# Patient Record
Sex: Female | Born: 1955 | Race: Black or African American | Hispanic: No | Smoking: Former smoker
Health system: Southern US, Community
[De-identification: ages and names within clinical notes are randomized; demographics above are authoritative.]

## PROBLEM LIST (undated history)

## (undated) DIAGNOSIS — M170 Bilateral primary osteoarthritis of knee: Secondary | ICD-10-CM

## (undated) DIAGNOSIS — I4891 Unspecified atrial fibrillation: Secondary | ICD-10-CM

## (undated) DIAGNOSIS — J189 Pneumonia, unspecified organism: Secondary | ICD-10-CM

## (undated) DIAGNOSIS — Z8679 Personal history of other diseases of the circulatory system: Secondary | ICD-10-CM

## (undated) DIAGNOSIS — E559 Vitamin D deficiency, unspecified: Secondary | ICD-10-CM

## (undated) DIAGNOSIS — E785 Hyperlipidemia, unspecified: Secondary | ICD-10-CM

## (undated) DIAGNOSIS — R011 Cardiac murmur, unspecified: Secondary | ICD-10-CM

## (undated) DIAGNOSIS — I10 Essential (primary) hypertension: Secondary | ICD-10-CM

## (undated) DIAGNOSIS — E119 Type 2 diabetes mellitus without complications: Secondary | ICD-10-CM

## (undated) DIAGNOSIS — G4730 Sleep apnea, unspecified: Secondary | ICD-10-CM

## (undated) DIAGNOSIS — Z87442 Personal history of urinary calculi: Secondary | ICD-10-CM

## (undated) DIAGNOSIS — D860 Sarcoidosis of lung: Secondary | ICD-10-CM

## (undated) HISTORY — PX: ABLATION: SHX5711

## (undated) HISTORY — PX: STRABISMUS SURGERY: SHX218

## (undated) HISTORY — PX: BREAST BIOPSY: SHX20

## (undated) HISTORY — PX: BREAST EXCISIONAL BIOPSY: SUR124

## (undated) HISTORY — PX: TUBAL LIGATION: SHX77

---

## 1978-10-10 HISTORY — PX: APPENDECTOMY: SHX54

## 2003-10-11 HISTORY — PX: CARDIAC ELECTROPHYSIOLOGY MAPPING AND ABLATION: SHX1292

## 2003-10-11 HISTORY — PX: LUNG BIOPSY: SHX232

## 2006-06-21 ENCOUNTER — Ambulatory Visit: Payer: Self-pay | Admitting: Internal Medicine

## 2006-12-18 ENCOUNTER — Ambulatory Visit: Payer: Self-pay | Admitting: Internal Medicine

## 2007-04-04 ENCOUNTER — Ambulatory Visit: Payer: Self-pay | Admitting: Internal Medicine

## 2007-08-09 ENCOUNTER — Ambulatory Visit: Payer: Self-pay | Admitting: Internal Medicine

## 2008-10-04 ENCOUNTER — Ambulatory Visit: Payer: Self-pay | Admitting: Family Medicine

## 2009-03-02 ENCOUNTER — Ambulatory Visit: Payer: Self-pay | Admitting: Internal Medicine

## 2009-07-08 ENCOUNTER — Ambulatory Visit: Payer: Self-pay | Admitting: Gastroenterology

## 2009-09-13 ENCOUNTER — Ambulatory Visit: Payer: Self-pay | Admitting: Internal Medicine

## 2010-03-18 ENCOUNTER — Ambulatory Visit: Payer: Self-pay | Admitting: Internal Medicine

## 2011-02-21 ENCOUNTER — Emergency Department: Payer: Self-pay | Admitting: Emergency Medicine

## 2011-10-11 HISTORY — PX: BREAST EXCISIONAL BIOPSY: SUR124

## 2012-08-04 ENCOUNTER — Emergency Department: Payer: Self-pay | Admitting: Emergency Medicine

## 2012-08-23 ENCOUNTER — Ambulatory Visit: Payer: Self-pay | Admitting: Internal Medicine

## 2012-09-04 ENCOUNTER — Ambulatory Visit: Payer: Self-pay | Admitting: Emergency Medicine

## 2012-09-04 LAB — BASIC METABOLIC PANEL
Anion Gap: 6 — ABNORMAL LOW (ref 7–16)
BUN: 7 mg/dL (ref 7–18)
Chloride: 102 mmol/L (ref 98–107)
Co2: 30 mmol/L (ref 21–32)
Creatinine: 0.88 mg/dL (ref 0.60–1.30)
EGFR (African American): >60
Osmolality: 277 (ref 275–301)

## 2012-09-13 ENCOUNTER — Ambulatory Visit: Payer: Self-pay | Admitting: Emergency Medicine

## 2013-02-05 ENCOUNTER — Ambulatory Visit: Payer: Self-pay | Admitting: Internal Medicine

## 2013-08-07 ENCOUNTER — Emergency Department: Payer: Self-pay | Admitting: Emergency Medicine

## 2013-08-08 LAB — CBC
HGB: 11.6 g/dL — ABNORMAL LOW (ref 12.0–16.0)
MCV: 74 fL — ABNORMAL LOW (ref 80–100)
RDW: 14.2 % (ref 11.5–14.5)
WBC: 7.6 10*3/uL (ref 3.6–11.0)

## 2013-08-08 LAB — BASIC METABOLIC PANEL
Anion Gap: 6 — ABNORMAL LOW (ref 7–16)
Calcium, Total: 9.2 mg/dL (ref 8.5–10.1)
Chloride: 103 mmol/L (ref 98–107)
Co2: 28 mmol/L (ref 21–32)
EGFR (African American): 59 — ABNORMAL LOW
EGFR (Non-African Amer.): 50 — ABNORMAL LOW
Glucose: 209 mg/dL — ABNORMAL HIGH (ref 65–99)
Potassium: 3.6 mmol/L (ref 3.5–5.1)

## 2013-09-13 ENCOUNTER — Ambulatory Visit: Payer: Self-pay | Admitting: Internal Medicine

## 2014-03-14 ENCOUNTER — Ambulatory Visit: Payer: Self-pay | Admitting: Internal Medicine

## 2014-07-04 ENCOUNTER — Ambulatory Visit: Payer: Self-pay | Admitting: Nurse Practitioner

## 2014-09-26 ENCOUNTER — Ambulatory Visit: Payer: Self-pay | Admitting: Internal Medicine

## 2015-01-27 NOTE — Op Note (Signed)
PATIENT NAME:  Judy Barber, Judy Barber MR#:  [id number] DATE OF BIRTH:  [birth year]  DATE OF PROCEDURE:  09/13/2012  PREOPERATIVE DIAGNOSIS:   Not dictated.   POSTOPERATIVE DIAGNOSIS:  Not dictated.   PROCEDURE:  Not dictated.   SURGEON:  Jovita GammaMasud Stanly Si, MD  ANESTHESIA:  General.   INDICATION FOR PROCEDURE:  This patient had microcalcification in the right breast. The patient was then brought in for needle localization biopsy. After that was done, the patient was brought to surgery.   DESCRIPTION OF PROCEDURE:  Under general anesthesia, the right breast was then prepped and draped. The needle was coming laterally from the axilla site going into the middle of the breast. It was quite very deep down. The localized area of the microcalcification also was also a distance from the wire. After infiltrating with 1% Xylocaine, the needle insertion site incision was made and I had to go down way deep in the breast tissue to remove this wire as well as the microcalcification. The patient had a lot of oozing, although there was no major bleeding, but there was a lot of oozing. I tried to stop the bleeding with a Bovie, but finally I thought maybe we should put in a drain so that she does not form hematoma. A small Penrose drain was then put in. Subcuticular closure was performed with 3-0 Vicryl, 4-0 Vicryl and sutures. Skin was closed with nylon stitches and the drain was sutured with nylon sutures. Drain was removed later on. The patient tolerated the procedure well and was sent to the recovery room in satisfactory condition.   ____________________________ Alton RevereMasud S. Cecelia ByarsHashmi, MD msh:ap D: 09/13/2012 15:08:24 ET T: 09/13/2012 15:26:24 ET JOB#: [id number] cc: Nola Botkins S. Cecelia ByarsHashmi, MD, <Dictator> Meryle ReadyMASUD S Durene Dodge MD ELECTRONICALLY SIGNED 09/17/2012 13:55

## 2015-10-11 HISTORY — PX: BREAST BIOPSY: SHX20

## 2016-01-01 DIAGNOSIS — D860 Sarcoidosis of lung: Secondary | ICD-10-CM | POA: Insufficient documentation

## 2016-01-01 DIAGNOSIS — I10 Essential (primary) hypertension: Secondary | ICD-10-CM | POA: Insufficient documentation

## 2016-01-01 DIAGNOSIS — E1165 Type 2 diabetes mellitus with hyperglycemia: Secondary | ICD-10-CM | POA: Insufficient documentation

## 2016-01-01 DIAGNOSIS — E559 Vitamin D deficiency, unspecified: Secondary | ICD-10-CM | POA: Insufficient documentation

## 2016-03-24 ENCOUNTER — Other Ambulatory Visit: Payer: Self-pay | Admitting: Family Medicine

## 2016-04-11 ENCOUNTER — Other Ambulatory Visit: Payer: Self-pay | Admitting: Family Medicine

## 2016-04-11 DIAGNOSIS — Z1231 Encounter for screening mammogram for malignant neoplasm of breast: Secondary | ICD-10-CM

## 2017-03-01 HISTORY — PX: KNEE ARTHROSCOPY W/ MENISCECTOMY: SHX1879

## 2017-10-10 HISTORY — PX: COLONOSCOPY: SHX174

## 2018-05-21 ENCOUNTER — Other Ambulatory Visit: Payer: Self-pay | Admitting: Family Medicine

## 2018-05-21 DIAGNOSIS — M7989 Other specified soft tissue disorders: Secondary | ICD-10-CM

## 2018-05-22 ENCOUNTER — Other Ambulatory Visit: Payer: Self-pay | Admitting: Medical Oncology

## 2018-05-22 DIAGNOSIS — M7989 Other specified soft tissue disorders: Secondary | ICD-10-CM

## 2018-05-24 ENCOUNTER — Ambulatory Visit: Payer: 59

## 2020-01-01 ENCOUNTER — Other Ambulatory Visit: Payer: Self-pay | Admitting: Family Medicine

## 2020-01-01 DIAGNOSIS — Z1231 Encounter for screening mammogram for malignant neoplasm of breast: Secondary | ICD-10-CM

## 2020-01-13 ENCOUNTER — Ambulatory Visit
Admission: RE | Admit: 2020-01-13 | Discharge: 2020-01-13 | Disposition: A | Discharge status: Home / Self Care | Payer: 59 | Source: Ambulatory Visit | Attending: Family Medicine | Admitting: Family Medicine

## 2020-01-13 ENCOUNTER — Other Ambulatory Visit: Payer: Self-pay

## 2020-01-13 ENCOUNTER — Encounter (INDEPENDENT_AMBULATORY_CARE_PROVIDER_SITE_OTHER): Payer: Self-pay

## 2020-01-13 DIAGNOSIS — Z1231 Encounter for screening mammogram for malignant neoplasm of breast: Secondary | ICD-10-CM

## 2020-01-14 ENCOUNTER — Inpatient Hospital Stay
Admission: RE | Admit: 2020-01-14 | Discharge: 2020-01-14 | Disposition: A | Discharge status: Home / Self Care | Payer: Self-pay | Source: Ambulatory Visit | Attending: *Deleted | Admitting: *Deleted

## 2020-01-14 ENCOUNTER — Other Ambulatory Visit: Payer: Self-pay | Admitting: *Deleted

## 2020-01-14 DIAGNOSIS — Z1231 Encounter for screening mammogram for malignant neoplasm of breast: Secondary | ICD-10-CM

## 2020-04-16 ENCOUNTER — Emergency Department
Admission: EM | Admit: 2020-04-16 | Discharge: 2020-04-16 | Disposition: A | Discharge status: Home / Self Care | Payer: 59 | Attending: Emergency Medicine | Admitting: Emergency Medicine

## 2020-04-16 ENCOUNTER — Other Ambulatory Visit: Payer: Self-pay

## 2020-04-16 ENCOUNTER — Encounter: Payer: Self-pay | Admitting: Emergency Medicine

## 2020-04-16 DIAGNOSIS — S0006XA Insect bite (nonvenomous) of scalp, initial encounter: Secondary | ICD-10-CM | POA: Diagnosis not present

## 2020-04-16 DIAGNOSIS — Y929 Unspecified place or not applicable: Secondary | ICD-10-CM | POA: Diagnosis not present

## 2020-04-16 DIAGNOSIS — Y999 Unspecified external cause status: Secondary | ICD-10-CM | POA: Insufficient documentation

## 2020-04-16 DIAGNOSIS — S00262A Insect bite (nonvenomous) of left eyelid and periocular area, initial encounter: Secondary | ICD-10-CM | POA: Diagnosis not present

## 2020-04-16 DIAGNOSIS — Y939 Activity, unspecified: Secondary | ICD-10-CM | POA: Diagnosis not present

## 2020-04-16 DIAGNOSIS — W57XXXA Bitten or stung by nonvenomous insect and other nonvenomous arthropods, initial encounter: Secondary | ICD-10-CM | POA: Insufficient documentation

## 2020-04-16 DIAGNOSIS — S0990XA Unspecified injury of head, initial encounter: Secondary | ICD-10-CM | POA: Diagnosis present

## 2020-04-16 MED ORDER — HYDROXYZINE HCL 25 MG PO TABS: 25.0000 mg | ORAL_TABLET | Freq: Three times a day (TID) | ORAL | 0 refills | Status: DC | PRN

## 2020-04-16 MED ORDER — HYDROMORPHONE HCL 1 MG/ML IJ SOLN
1.0000 mg | Freq: Once | INTRAMUSCULAR | Status: AC
  Administered 2020-04-16: 1 mg via INTRAVENOUS
  Filled 2020-04-16: qty 1

## 2020-04-16 MED ORDER — TRAMADOL HCL 50 MG PO TABS: 50.0000 mg | ORAL_TABLET | Freq: Two times a day (BID) | ORAL | 0 refills | Status: AC | PRN

## 2020-04-16 MED ORDER — METHYLPREDNISOLONE SODIUM SUCC 125 MG IJ SOLR
125.0000 mg | Freq: Once | INTRAMUSCULAR | Status: AC
  Administered 2020-04-16: 125 mg via INTRAVENOUS
  Filled 2020-04-16: qty 2

## 2020-04-16 MED ORDER — METHYLPREDNISOLONE 4 MG PO TBPK: ORAL_TABLET | ORAL | 0 refills | Status: DC

## 2020-04-16 MED ORDER — DIPHENHYDRAMINE HCL 50 MG/ML IJ SOLN
25.0000 mg | Freq: Once | INTRAMUSCULAR | Status: AC
  Administered 2020-04-16: 25 mg via INTRAVENOUS
  Filled 2020-04-16: qty 1

## 2020-04-16 NOTE — Discharge Instructions (Signed)
Follow discharge care instruction take medication as directed. °

## 2020-04-16 NOTE — ED Provider Notes (Addendum)
MSE was initiated and I personally evaluated the patient and placed orders (if any) at  9:04 AM on April 16, 2020.  Insect bite to facial and scalp this morning. States pain and itching. Denies N/V. Denies vision disturbance or vertigo.  The patient appears stable so that the remainder of the MSE may be completed by another provider.   Joni Reining, PA-C 04/16/20 0905    Joni Reining, PA-C 04/16/20 3976    Chesley Noon, MD 04/16/20 [phone]

## 2020-04-16 NOTE — ED Triage Notes (Signed)
Pt reports was stung by something above her left eye and in her head. Pt unsure of what she was stung by.

## 2020-04-16 NOTE — ED Provider Notes (Signed)
[facility] Emergency Department Provider Note   ____________________________________________   First MD Initiated Contact with Patient 04/16/20 1015     (approximate)  I have reviewed the triage vital signs and the nursing notes.   HISTORY  Chief Complaint Insect Bite    HPI Judy Barber is a 64 y.o. female patient complaining of insect bite above her left eye and also the right superior aspect of the scalp.  Patient unsure what insect bit her.  Patient complaining of pain and swelling left supraorbital area and right scalp.  Also complaining of itching.  Patient rates her pain as a 7/10.  Patient described pain is "achy".  No palliative measure for complaint.         History reviewed. No pertinent past medical history.  There are no problems to display for this patient.   Past Surgical History:  Procedure Laterality Date   BREAST BIOPSY Left    benign   BREAST EXCISIONAL BIOPSY Right    benign    Prior to Admission medications   Medication Sig Start Date End Date Taking? Authorizing Provider  hydrOXYzine (ATARAX/VISTARIL) 25 MG tablet Take 1 tablet (25 mg total) by mouth 3 (three) times daily as needed. 04/16/20   Joni Reining, PA-C  methylPREDNISolone (MEDROL DOSEPAK) 4 MG TBPK tablet Take Tapered dose as directed 04/16/20   Joni Reining, PA-C  traMADol (ULTRAM) 50 MG tablet Take 1 tablet (50 mg total) by mouth every 12 (twelve) hours as needed for up to 5 days. 04/16/20 04/21/20  Joni Reining, PA-C    Allergies Patient has no allergy information on record.  Family History  Problem Relation Age of Onset   Breast cancer Cousin     Social History Social History   Tobacco Use   Smoking status: Not on file  Substance Use Topics   Alcohol use: Not on file   Drug use: Not on file    Review of Systems Constitutional: No fever/chills Eyes: No visual changes. ENT: No sore throat. Cardiovascular: Denies chest  pain. Respiratory: Denies shortness of breath. Gastrointestinal: No abdominal pain.  No nausea, no vomiting.  No diarrhea.  No constipation. Genitourinary: Negative for dysuria. Musculoskeletal: Negative for back pain. Skin: Red and swollen left supraorbital area and right scalp.  Neurological: Positive for headaches, but denies focal weakness or numbness.   ____________________________________________   PHYSICAL EXAM:  VITAL SIGNS: ED Triage Vitals [04/16/20 0847]  Enc Vitals Group     BP (!) 187/61     Pulse Rate (!) 110     Resp 18     Temp 97.7 F (36.5 C)     Temp Source Oral     SpO2 100 %     Weight      Height      Head Circumference      Peak Flow      Pain Score      Pain Loc      Pain Edu?      Excl. in GC?    Constitutional: Alert and oriented. Well appearing and in no acute distress. Cardiovascular: Normal rate, regular rhythm. Grossly normal heart sounds.  Good peripheral circulation.  Elevated blood pressure. Respiratory: Normal respiratory effort.  No retractions. Lungs CTAB. Neurologic:  Normal speech and language. No gross focal neurologic deficits are appreciated. No gait instability. Skin: Papular vesicular lesion left supraorbital area and right lateral scalp. Psychiatric: Mood and affect are normal. Speech and behavior are  normal.  ____________________________________________   LABS (all labs ordered are listed, but only abnormal results are displayed)  Labs Reviewed - No data to display ____________________________________________  EKG   ____________________________________________  RADIOLOGY  ED MD interpretation:    Official radiology report(s): No results found.  ____________________________________________   PROCEDURES  Procedure(s) performed (including Critical Care):  Procedures   ____________________________________________   INITIAL IMPRESSION / ASSESSMENT AND PLAN / ED COURSE  As part of my medical decision  making, I reviewed the following data within the electronic MEDICAL RECORD NUMBER     Patient presents with pain to the left supraorbital area and left scalp secondary to suspected insect bites.  Patient given discharge care instructions and a work note.  Patient vies take medication as directed.    Judy Barber was evaluated in Emergency Department on 04/16/2020 for the symptoms described in the history of present illness. She was evaluated in the context of the global COVID-19 pandemic, which necessitated consideration that the patient might be at risk for infection with the SARS-CoV-2 virus that causes COVID-19. Institutional protocols and algorithms that pertain to the evaluation of patients at risk for COVID-19 are in a state of rapid change based on information released by regulatory bodies including the CDC and federal and state organizations. These policies and algorithms were followed during the patient's care in the ED.       ____________________________________________   FINAL CLINICAL IMPRESSION(S) / ED DIAGNOSES  Final diagnoses:  Insect bite of scalp, initial encounter  Insect bite of left periocular area, initial encounter     ED Discharge Orders         Ordered    hydrOXYzine (ATARAX/VISTARIL) 25 MG tablet  3 times daily PRN     Discontinue  Reprint     04/16/20 1045    methylPREDNISolone (MEDROL DOSEPAK) 4 MG TBPK tablet     Discontinue  Reprint     04/16/20 1045    traMADol (ULTRAM) 50 MG tablet  Every 12 hours PRN     Discontinue  Reprint     04/16/20 1045           Note:  This document was prepared using Dragon voice recognition software and may include unintentional dictation errors.    Joni Reining, PA-C 04/16/20 1051    Chesley Noon, MD 04/16/20 1326

## 2020-10-15 ENCOUNTER — Other Ambulatory Visit: Payer: Self-pay

## 2020-10-15 ENCOUNTER — Encounter: Payer: Self-pay | Admitting: Emergency Medicine

## 2020-10-15 ENCOUNTER — Emergency Department
Admission: EM | Admit: 2020-10-15 | Discharge: 2020-10-15 | Disposition: A | Discharge status: Home / Self Care | Payer: 59 | Attending: Emergency Medicine | Admitting: Emergency Medicine

## 2020-10-15 ENCOUNTER — Emergency Department: Payer: 59

## 2020-10-15 DIAGNOSIS — Z87891 Personal history of nicotine dependence: Secondary | ICD-10-CM | POA: Diagnosis not present

## 2020-10-15 DIAGNOSIS — E119 Type 2 diabetes mellitus without complications: Secondary | ICD-10-CM | POA: Insufficient documentation

## 2020-10-15 DIAGNOSIS — I10 Essential (primary) hypertension: Secondary | ICD-10-CM | POA: Diagnosis not present

## 2020-10-15 DIAGNOSIS — Z20822 Contact with and (suspected) exposure to covid-19: Secondary | ICD-10-CM | POA: Insufficient documentation

## 2020-10-15 DIAGNOSIS — R112 Nausea with vomiting, unspecified: Secondary | ICD-10-CM

## 2020-10-15 DIAGNOSIS — M25511 Pain in right shoulder: Secondary | ICD-10-CM | POA: Insufficient documentation

## 2020-10-15 LAB — CBC WITH DIFFERENTIAL/PLATELET
Abs Immature Granulocytes: 0.03 10*3/uL (ref 0.00–0.07)
Basophils Absolute: 0 10*3/uL (ref 0.0–0.1)
Basophils Relative: 0 %
Eosinophils Absolute: 0.1 10*3/uL (ref 0.0–0.5)
Eosinophils Relative: 1 %
HCT: 42.6 % (ref 36.0–46.0)
Hemoglobin: 13.4 g/dL (ref 12.0–15.0)
Immature Granulocytes: 1 %
Lymphocytes Relative: 20 %
Lymphs Abs: 1.3 10*3/uL (ref 0.7–4.0)
MCH: 23.4 pg — ABNORMAL LOW (ref 26.0–34.0)
MCHC: 31.5 g/dL (ref 30.0–36.0)
MCV: 74.3 fL — ABNORMAL LOW (ref 80.0–100.0)
Monocytes Absolute: 0.4 10*3/uL (ref 0.1–1.0)
Monocytes Relative: 5 %
Neutro Abs: 4.7 10*3/uL (ref 1.7–7.7)
Neutrophils Relative %: 73 %
Platelets: 279 10*3/uL (ref 150–400)
RBC: 5.73 MIL/uL — ABNORMAL HIGH (ref 3.87–5.11)
RDW: 14.1 % (ref 11.5–15.5)
WBC: 6.5 10*3/uL (ref 4.0–10.5)
nRBC: 0 % (ref 0.0–0.2)

## 2020-10-15 LAB — LIPASE, BLOOD: Lipase: 27 U/L (ref 11–51)

## 2020-10-15 LAB — COMPREHENSIVE METABOLIC PANEL
ALT: 14 U/L (ref 0–44)
AST: 18 U/L (ref 15–41)
Albumin: 4 g/dL (ref 3.5–5.0)
Alkaline Phosphatase: 98 U/L (ref 38–126)
Anion gap: 10 (ref 5–15)
BUN: 17 mg/dL (ref 8–23)
CO2: 23 mmol/L (ref 22–32)
Calcium: 9.4 mg/dL (ref 8.9–10.3)
Chloride: 106 mmol/L (ref 98–111)
Creatinine, Ser: 0.79 mg/dL (ref 0.44–1.00)
GFR, Estimated: >60 mL/min (ref >60)
Glucose, Bld: 179 mg/dL — ABNORMAL HIGH (ref 70–99)
Potassium: 4 mmol/L (ref 3.5–5.1)
Sodium: 139 mmol/L (ref 135–145)
Total Bilirubin: 0.5 mg/dL (ref 0.3–1.2)
Total Protein: 7.9 g/dL (ref 6.5–8.1)

## 2020-10-15 LAB — TROPONIN I (HIGH SENSITIVITY)
Troponin I (High Sensitivity): 3 ng/L (ref <18)
Troponin I (High Sensitivity): 5 ng/L (ref <18)

## 2020-10-15 LAB — SARS CORONAVIRUS 2 (TAT 6-24 HRS): SARS Coronavirus 2: NEGATIVE

## 2020-10-15 MED ORDER — ONDANSETRON 4 MG PO TBDP
4.0000 mg | ORAL_TABLET | Freq: Once | ORAL | Status: AC
  Administered 2020-10-15: 4 mg via ORAL
  Filled 2020-10-15: qty 1

## 2020-10-15 MED ORDER — ONDANSETRON 4 MG PO TBDP: 4.0000 mg | ORAL_TABLET | Freq: Three times a day (TID) | ORAL | 0 refills | Status: DC | PRN

## 2020-10-15 MED ORDER — KETOROLAC TROMETHAMINE 30 MG/ML IJ SOLN
15.0000 mg | Freq: Once | INTRAMUSCULAR | Status: AC
  Administered 2020-10-15: 15 mg via INTRAVENOUS
  Filled 2020-10-15: qty 1

## 2020-10-15 MED ORDER — ACETAMINOPHEN 500 MG PO TABS
1000.0000 mg | ORAL_TABLET | Freq: Once | ORAL | Status: AC
  Administered 2020-10-15: 1000 mg via ORAL
  Filled 2020-10-15: qty 2

## 2020-10-15 NOTE — ED Triage Notes (Signed)
Pt to triage via w/c with no distress noted; pt reports N/V that began while at work tonight; denies pain

## 2020-10-15 NOTE — ED Provider Notes (Signed)
[facility] Emergency Department Provider Note  ____________________________________________   Event Date/Time   First MD Initiated Contact with Patient 10/15/20 [phone]     (approximate)  I have reviewed the triage vital signs and the nursing notes.   HISTORY  Chief Complaint Emesis    HPI Judy Barber is a 65 y.o. female with hypertension, diabetes who comes in with vomiting.  Patient states that she was at work when she started feeling sweaty all over and had some nausea and vomiting x1.  The vomiting was nonbloody and nonbilious.  She denies any abdominal pain associated with it.  She was told by her manager to come into the emergency room.  Patient's been waiting for 7 hours to be seen.  While she was waiting she developed some pain in her right shoulder.  She is not sure if is just from sitting in the chair for that long.  Denies any heart problems in the past.  Denies any pain up in her chest or in her abdomen.  Denies any shortness of breath, new unilateral leg swelling, long travel, recent surgery.  Denies any fevers.  Is around COVID patients so is concerned that they could be COVID.  Also has a history of pneumonia.       History reviewed. No pertinent past medical history.  There are no problems to display for this patient.   Past Surgical History:  Procedure Laterality Date  . BREAST BIOPSY Left    benign  . BREAST EXCISIONAL BIOPSY Right    benign    Prior to Admission medications   Medication Sig Start Date End Date Taking? Authorizing Provider  hydrOXYzine (ATARAX/VISTARIL) 25 MG tablet Take 1 tablet (25 mg total) by mouth 3 (three) times daily as needed. 04/16/20   Joni Reining, PA-C  methylPREDNISolone (MEDROL DOSEPAK) 4 MG TBPK tablet Take Tapered dose as directed 04/16/20   Joni Reining, PA-C    Allergies Patient has no known allergies.  Family History  Problem Relation Age of Onset  . Breast cancer Cousin     Social  History Social History   Tobacco Use  . Smoking status: Former Games developer  . Smokeless tobacco: Never Used  Vaping Use  . Vaping Use: Never used      Review of Systems Constitutional: No fever/chills Eyes: No visual changes. ENT: No sore throat. Cardiovascular: Denies chest pain.  Positive right back pain Respiratory: Denies shortness of breath. Gastrointestinal: No abdominal pain.  Positive nausea and vomiting no diarrhea.  No constipation. Genitourinary: Negative for dysuria. Musculoskeletal: Positive right back pain Skin: Negative for rash. Neurological: Negative for headaches, focal weakness or numbness. All other ROS negative ____________________________________________   PHYSICAL EXAM:  VITAL SIGNS: ED Triage Vitals  Enc Vitals Group     BP 10/15/20 0224 (!) 161/73     Pulse Rate 10/15/20 0224 82     Resp 10/15/20 0224 18     Temp 10/15/20 0224 (!) 97.5 F (36.4 C)     Temp Source 10/15/20 0224 Oral     SpO2 10/15/20 0224 98 %     Weight 10/15/20 0225 184 lb (83.5 kg)     Height 10/15/20 0225 5\' 3"  (1.6 m)     Head Circumference --      Peak Flow --      Pain Score 10/15/20 0227 0     Pain Loc --      Pain Edu? --  Excl. in GC? --     Constitutional: Alert and oriented. Well appearing and in no acute distress. Eyes: Conjunctivae are normal. EOMI. Head: Atraumatic. Nose: No congestion/rhinnorhea. Mouth/Throat: Mucous membranes are moist.   Neck: No stridor. Trachea Midline. FROM Cardiovascular: Normal rate, regular rhythm. Grossly normal heart sounds.  Good peripheral circulation. Respiratory: Normal respiratory effort.  No retractions. Lungs CTAB. Gastrointestinal: Soft and nontender. No distention. No abdominal bruits.  Musculoskeletal: No lower extremity tenderness nor edema. Tenderness to right upper back. No joint effusions. Neurologic:  Normal speech and language. No gross focal neurologic deficits are appreciated.  Skin:  Skin is warm, dry and  intact. No rash noted over the right upper back Psychiatric: Mood and affect are normal. Speech and behavior are normal. GU: Deferred   ____________________________________________   LABS (all labs ordered are listed, but only abnormal results are displayed)  Labs Reviewed  CBC WITH DIFFERENTIAL/PLATELET - Abnormal; Notable for the following components:      Result Value   RBC 5.73 (*)    MCV 74.3 (*)    MCH 23.4 (*)    All other components within normal limits  COMPREHENSIVE METABOLIC PANEL - Abnormal; Notable for the following components:   Glucose, Bld 179 (*)    All other components within normal limits  LIPASE, BLOOD  URINALYSIS, COMPLETE (UACMP) WITH MICROSCOPIC   ____________________________________________   ED ECG REPORT I, Concha Se, the attending physician, personally viewed and interpreted this ECG.  Normal sinus no st elevation no twi normal intervals ____________________________________________  RADIOLOGY   Official radiology report(s): DG Chest 2 View  Result Date: 10/15/2020 CLINICAL DATA:  RIGHT-sided back pain since early this morning EXAM: CHEST - 2 VIEW COMPARISON:  Rib radiographs of 08/04/2012 FINDINGS: Normal heart size, mediastinal contours, and pulmonary vascularity. Scattered chronic interstitial changes throughout both lungs, similar to prior exam. This likely represents underlying fibrosis/chronic interstitial disease. No definite acute infiltrate, pleural effusion or pneumothorax. Scattered endplate spur formation thoracic spine. IMPRESSION: Chronic accentuation of interstitial markings likely representing fibrosis or chronic interstitial lung disease. No acute abnormalities. Electronically Signed   By: Ulyses Southward M.D.   On: 10/15/2020 10:02    ____________________________________________   PROCEDURES  Procedure(s) performed (including Critical Care):  Procedures   ____________________________________________   INITIAL IMPRESSION /  ASSESSMENT AND PLAN / ED COURSE  Judy Barber was evaluated in Emergency Department on 10/15/2020 for the symptoms described in the history of present illness. She was evaluated in the context of the global COVID-19 pandemic, which necessitated consideration that the patient might be at risk for infection with the SARS-CoV-2 virus that causes COVID-19. Institutional protocols and algorithms that pertain to the evaluation of patients at risk for COVID-19 are in a state of rapid change based on information released by regulatory bodies including the CDC and federal and state organizations. These policies and algorithms were followed during the patient's care in the ED.    Patient is a 65 year old who comes in with initially nausea vomiting x1 and sweating.  Labs were ordered to evaluate for eletrolyte abnormalities, AKI, DKA.  Labs are reassuring.  Patient is now reporting some pain in her right shoulder possibly just from sitting for so long but consider atypical presentation of ACS and will order cardiac markers.  Will also order chest x-ray to evaluate for pneumonia, pneumothorax.  Considered PE but thought to be less likely given no other risk factors and she is not short of breath.. not pleuritic pain.  White count is normal no evidence of infection.  Hemoglobin is at baseline. Glucose slightly elevated 179.  No evidence of AKI Lipase was normal.  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   Trop negative x2   Re-evaluated pt and chest pain free now. Pt feels comfortable with dc home. COVID swab pending. Wrote note for work.      ____________________________________________   FINAL CLINICAL IMPRESSION(S) / ED DIAGNOSES   Final diagnoses:  Nausea and vomiting, intractability of vomiting not specified, unspecified vomiting type      MEDICATIONS GIVEN DURING THIS VISIT:  Medications  ondansetron (ZOFRAN-ODT) disintegrating tablet 4 mg (4 mg Oral Given 10/15/20 0233)   ketorolac (TORADOL) 30 MG/ML injection 15 mg (15 mg Intravenous Given 10/15/20 1018)  acetaminophen (TYLENOL) tablet 1,000 mg (1,000 mg Oral Given 10/15/20 1019)     ED Discharge Orders         Ordered    ondansetron (ZOFRAN ODT) 4 MG disintegrating tablet  Every 8 hours PRN        10/15/20 1121           Note:  This document was prepared using Dragon voice recognition software and may include unintentional dictation errors.   Vanessa Viola, MD 10/15/20 [phone]

## 2020-10-15 NOTE — Discharge Instructions (Signed)
Take the Zofran to help with any nausea.  Take Tylenol and ibuprofen to help with any pain.  No evidence of heart attack or pneumonia but return to the ER if you develop worsening symptoms

## 2021-01-13 ENCOUNTER — Other Ambulatory Visit: Payer: Self-pay | Admitting: Family Medicine

## 2021-01-13 DIAGNOSIS — Z1231 Encounter for screening mammogram for malignant neoplasm of breast: Secondary | ICD-10-CM

## 2021-01-26 ENCOUNTER — Other Ambulatory Visit: Payer: Self-pay

## 2021-01-26 ENCOUNTER — Ambulatory Visit
Admission: RE | Admit: 2021-01-26 | Discharge: 2021-01-26 | Disposition: A | Discharge status: Home / Self Care | Payer: 59 | Source: Ambulatory Visit | Attending: Family Medicine | Admitting: Family Medicine

## 2021-01-26 DIAGNOSIS — Z1231 Encounter for screening mammogram for malignant neoplasm of breast: Secondary | ICD-10-CM | POA: Diagnosis present

## 2021-09-13 ENCOUNTER — Ambulatory Visit (INDEPENDENT_AMBULATORY_CARE_PROVIDER_SITE_OTHER): Payer: 59 | Admitting: Obstetrics and Gynecology

## 2021-09-13 ENCOUNTER — Encounter: Payer: Self-pay | Admitting: Obstetrics and Gynecology

## 2021-09-13 ENCOUNTER — Other Ambulatory Visit: Payer: Self-pay

## 2021-09-13 VITALS — BP 122/70 | Ht 63.0 in | Wt 204.0 lb

## 2021-09-13 DIAGNOSIS — R87610 Atypical squamous cells of undetermined significance on cytologic smear of cervix (ASC-US): Secondary | ICD-10-CM

## 2021-09-13 NOTE — Progress Notes (Signed)
Patient ID: Judy Barber, female   DOB: 10-08-1956, 65 y.o.   MRN: [id number]  Reason for Consult: Procedure   Referred by Perrin Maltese, MD  Subjective:     HPI:  Judy Barber is a 65 y.o. female. She is being referred today for a ASCUS HPV negative pap on 08/10/2021. She denies a history of prior abnormal pap smears. She denies a history of cervical procedures.   Gynecological History  No LMP recorded. Patient is postmenopausal.  History reviewed. No pertinent past medical history. Family History  Problem Relation Age of Onset   Breast cancer Cousin    Past Surgical History:  Procedure Laterality Date   BREAST BIOPSY Left    benign   BREAST EXCISIONAL BIOPSY Right    benign    Short Social History:  Social History   Tobacco Use   Smoking status: Former   Smokeless tobacco: Never  Substance Use Topics   Alcohol use: Not on file    No Known Allergies  Current Outpatient Medications  Medication Sig Dispense Refill   aspirin EC 81 MG tablet Take 81 mg by mouth daily. Swallow whole.     cyclobenzaprine (FLEXERIL) 5 MG tablet Take 5 mg by mouth 3 (three) times daily as needed for muscle spasms.     docusate sodium (COLACE) 100 MG capsule Take 100 mg by mouth 2 (two) times daily.     glipiZIDE (GLUCOTROL) 5 MG tablet Take 4 mg by mouth daily before breakfast.     hydrOXYzine (ATARAX/VISTARIL) 25 MG tablet Take 1 tablet (25 mg total) by mouth 3 (three) times daily as needed. 15 tablet 0   losartan (COZAAR) 25 MG tablet Take 25 mg by mouth daily.     meloxicam (MOBIC) 7.5 MG tablet Take 7.5 mg by mouth daily.     metFORMIN (GLUCOPHAGE) 500 MG tablet metformin 500 mg tablet     simvastatin (ZOCOR) 20 MG tablet Take 20 mg by mouth daily.     Vitamin D, Ergocalciferol, (DRISDOL) 1.25 MG (50000 UNIT) CAPS capsule Take 50,000 Units by mouth every 7 (seven) days.     methylPREDNISolone (MEDROL DOSEPAK) 4 MG TBPK tablet Take Tapered dose as directed (Patient not taking:  Reported on 09/13/2021) 21 tablet 0   ondansetron (ZOFRAN ODT) 4 MG disintegrating tablet Take 1 tablet (4 mg total) by mouth every 8 (eight) hours as needed for nausea or vomiting. (Patient not taking: Reported on 09/13/2021) 20 tablet 0   No current facility-administered medications for this visit.    Review of Systems  Constitutional: Negative for chills, fatigue, fever and unexpected weight change.  HENT: Negative for trouble swallowing.  Eyes: Negative for loss of vision.  Respiratory: Negative for cough, shortness of breath and wheezing.  Cardiovascular: Negative for chest pain, leg swelling, palpitations and syncope.  GI: Negative for abdominal pain, blood in stool, diarrhea, nausea and vomiting.  GU: Negative for difficulty urinating, dysuria, frequency and hematuria.  Musculoskeletal: Negative for back pain, leg pain and joint pain.  Skin: Negative for rash.  Neurological: Negative for dizziness, headaches, light-headedness, numbness and seizures.  Psychiatric: Negative for behavioral problem, confusion, depressed mood and sleep disturbance.       Objective:  Objective   Vitals:   09/13/21 0813  BP: 122/70  Weight: 204 lb (92.5 kg)  Height: 5\' 3"  (1.6 m)   Body mass index is 36.14 kg/m.  Physical Exam Vitals and nursing note reviewed. Exam conducted with a chaperone present.  Constitutional:      Appearance: Normal appearance. She is well-developed.  HENT:     Head: Normocephalic and atraumatic.  Eyes:     Extraocular Movements: Extraocular movements intact.     Pupils: Pupils are equal, round, and reactive to light.  Cardiovascular:     Rate and Rhythm: Normal rate and regular rhythm.  Pulmonary:     Effort: Pulmonary effort is normal. No respiratory distress.     Breath sounds: Normal breath sounds.  Abdominal:     General: Abdomen is flat.     Palpations: Abdomen is soft.  Genitourinary:    Comments: External: Normal appearing vulva. No lesions noted.   Speculum examination: Normal appearing cervix. No blood in the vaginal vault. No discharge.   Bimanual examination: Uterus midline, non-tender, normal in size, shape and contour.  No CMT. No adnexal masses. No adnexal tenderness. Pelvis not fixed. Musculoskeletal:        General: No signs of injury.  Skin:    General: Skin is warm and dry.  Neurological:     Mental Status: She is alert and oriented to person, place, and time.  Psychiatric:        Behavior: Behavior normal.        Thought Content: Thought content normal.        Judgment: Judgment normal.    Assessment/Plan:    65 yo with ASCUS HPV NEGATIVE PAP smear  Patients does not meet criteria for colposcopy. ASCCP recommends 3 years follow up for ASCUS pap smear with negative HPV based on 2019 updated guidelines. Advised patient of this practice changes. She elected to proceed with pelvic examination, but not colposcopy. Normal appearance of cervix today on examination.   More than 10 minutes were spent face to face with the patient in the room, reviewing the medical record, labs and images, and coordinating care for the patient. The plan of management was discussed in detail and counseling was provided.    Adelene Idler MD, [facility], [facility] 09/13/2021 8:23 AM

## 2021-10-19 DIAGNOSIS — M1712 Unilateral primary osteoarthritis, left knee: Secondary | ICD-10-CM | POA: Insufficient documentation

## 2021-10-20 ENCOUNTER — Other Ambulatory Visit: Payer: Self-pay | Admitting: Orthopedic Surgery

## 2021-10-20 DIAGNOSIS — M2352 Chronic instability of knee, left knee: Secondary | ICD-10-CM

## 2021-10-20 DIAGNOSIS — M2392 Unspecified internal derangement of left knee: Secondary | ICD-10-CM

## 2021-10-20 DIAGNOSIS — M1712 Unilateral primary osteoarthritis, left knee: Secondary | ICD-10-CM

## 2021-10-30 ENCOUNTER — Other Ambulatory Visit: Payer: Self-pay

## 2021-10-30 ENCOUNTER — Ambulatory Visit
Admission: RE | Admit: 2021-10-30 | Discharge: 2021-10-30 | Disposition: A | Discharge status: Home / Self Care | Payer: 59 | Source: Ambulatory Visit | Attending: Orthopedic Surgery | Admitting: Orthopedic Surgery

## 2021-10-30 DIAGNOSIS — M2352 Chronic instability of knee, left knee: Secondary | ICD-10-CM | POA: Diagnosis present

## 2021-10-30 DIAGNOSIS — M2392 Unspecified internal derangement of left knee: Secondary | ICD-10-CM | POA: Diagnosis present

## 2021-10-30 DIAGNOSIS — M1712 Unilateral primary osteoarthritis, left knee: Secondary | ICD-10-CM | POA: Insufficient documentation

## 2021-11-08 ENCOUNTER — Other Ambulatory Visit: Payer: Self-pay | Admitting: Internal Medicine

## 2021-11-08 DIAGNOSIS — Z1231 Encounter for screening mammogram for malignant neoplasm of breast: Secondary | ICD-10-CM

## 2021-12-08 ENCOUNTER — Encounter: Payer: Self-pay | Admitting: Internal Medicine

## 2022-02-02 ENCOUNTER — Other Ambulatory Visit: Payer: Self-pay | Admitting: Surgery

## 2022-02-04 ENCOUNTER — Inpatient Hospital Stay: Admission: RE | Admit: 2022-02-04 | Payer: 59 | Source: Ambulatory Visit

## 2022-02-04 ENCOUNTER — Encounter
Admission: RE | Admit: 2022-02-04 | Discharge: 2022-02-04 | Disposition: A | Discharge status: Home / Self Care | Payer: 59 | Source: Ambulatory Visit | Attending: Surgery | Admitting: Surgery

## 2022-02-04 VITALS — BP 155/62 | HR 89 | Temp 98.0°F | Resp 18 | Ht 63.0 in | Wt 200.0 lb

## 2022-02-04 DIAGNOSIS — Z01818 Encounter for other preprocedural examination: Secondary | ICD-10-CM | POA: Diagnosis present

## 2022-02-04 DIAGNOSIS — Z01812 Encounter for preprocedural laboratory examination: Secondary | ICD-10-CM

## 2022-02-04 HISTORY — DX: Bilateral primary osteoarthritis of knee: M17.0

## 2022-02-04 HISTORY — DX: Sleep apnea, unspecified: G47.30

## 2022-02-04 HISTORY — DX: Essential (primary) hypertension: I10

## 2022-02-04 HISTORY — DX: Sarcoidosis of lung: D86.0

## 2022-02-04 HISTORY — DX: Personal history of urinary calculi: Z87.442

## 2022-02-04 HISTORY — DX: Type 2 diabetes mellitus without complications: E11.9

## 2022-02-04 HISTORY — DX: Vitamin D deficiency, unspecified: E55.9

## 2022-02-04 HISTORY — DX: Hyperlipidemia, unspecified: E78.5

## 2022-02-04 HISTORY — DX: Pneumonia, unspecified organism: J18.9

## 2022-02-04 LAB — COMPREHENSIVE METABOLIC PANEL
ALT: 15 U/L (ref 0–44)
AST: 15 U/L (ref 15–41)
Albumin: 3.6 g/dL (ref 3.5–5.0)
Alkaline Phosphatase: 116 U/L (ref 38–126)
Anion gap: 8 (ref 5–15)
BUN: 14 mg/dL (ref 8–23)
CO2: 27 mmol/L (ref 22–32)
Calcium: 9.4 mg/dL (ref 8.9–10.3)
Chloride: 104 mmol/L (ref 98–111)
Creatinine, Ser: 0.78 mg/dL (ref 0.44–1.00)
GFR, Estimated: >60 mL/min (ref >60)
Glucose, Bld: 150 mg/dL — ABNORMAL HIGH (ref 70–99)
Potassium: 4.1 mmol/L (ref 3.5–5.1)
Sodium: 139 mmol/L (ref 135–145)
Total Bilirubin: 0.6 mg/dL (ref 0.3–1.2)
Total Protein: 7.3 g/dL (ref 6.5–8.1)

## 2022-02-04 LAB — TYPE AND SCREEN
ABO/RH(D): B POS
Antibody Screen: NEGATIVE

## 2022-02-04 LAB — CBC WITH DIFFERENTIAL/PLATELET
Abs Immature Granulocytes: 0.02 10*3/uL (ref 0.00–0.07)
Basophils Absolute: 0 10*3/uL (ref 0.0–0.1)
Basophils Relative: 0 %
Eosinophils Absolute: 0.1 10*3/uL (ref 0.0–0.5)
Eosinophils Relative: 2 %
HCT: 42 % (ref 36.0–46.0)
Hemoglobin: 12.7 g/dL (ref 12.0–15.0)
Immature Granulocytes: 0 %
Lymphocytes Relative: 25 %
Lymphs Abs: 1.4 10*3/uL (ref 0.7–4.0)
MCH: 21.9 pg — ABNORMAL LOW (ref 26.0–34.0)
MCHC: 30.2 g/dL (ref 30.0–36.0)
MCV: 72.3 fL — ABNORMAL LOW (ref 80.0–100.0)
Monocytes Absolute: 0.6 10*3/uL (ref 0.1–1.0)
Monocytes Relative: 10 %
Neutro Abs: 3.4 10*3/uL (ref 1.7–7.7)
Neutrophils Relative %: 63 %
Platelets: 358 10*3/uL (ref 150–400)
RBC: 5.81 MIL/uL — ABNORMAL HIGH (ref 3.87–5.11)
RDW: 14.3 % (ref 11.5–15.5)
WBC: 5.4 10*3/uL (ref 4.0–10.5)
nRBC: 0 % (ref 0.0–0.2)

## 2022-02-04 LAB — URINALYSIS, ROUTINE W REFLEX MICROSCOPIC
Bilirubin Urine: NEGATIVE
Glucose, UA: NEGATIVE mg/dL
Hgb urine dipstick: NEGATIVE
Ketones, ur: NEGATIVE mg/dL
Leukocytes,Ua: NEGATIVE
Nitrite: NEGATIVE
Protein, ur: NEGATIVE mg/dL
Specific Gravity, Urine: 1.02 (ref 1.005–1.030)
pH: 6 (ref 5.0–8.0)

## 2022-02-04 LAB — SURGICAL PCR SCREEN
MRSA, PCR: NEGATIVE
Staphylococcus aureus: NEGATIVE

## 2022-02-04 NOTE — Patient Instructions (Addendum)
Your procedure is scheduled on: Thursday, May 11 ?Report to the Registration Desk on the 1st floor of the [facility]. ?To find out your arrival time, please call [phone] between 1PM - 3PM on: Wednesday, May 10 ?If your arrival time is 6:00 am, do not arrive prior to that time as the [facility] entrance doors do not open until 6:00 am. ? ?REMEMBER: ?Instructions that are not followed completely may result in serious medical risk, up to and including death; or upon the discretion of your surgeon and anesthesiologist your surgery may need to be rescheduled. ? ?Do not eat food after midnight the night before surgery.  ?No gum chewing, lozengers or hard candies. ? ?You may however, drink water up to 2 hours before you are scheduled to arrive for your surgery. Do not drink anything within 2 hours of your scheduled arrival time. ? ?In addition, your doctor has ordered for you to drink the provided  ?Gatorade G2 ?Drinking this carbohydrate drink up to two hours before surgery helps to reduce insulin resistance and improve patient outcomes. Please complete drinking 2 hours prior to scheduled arrival time. ? ?TAKE THESE MEDICATIONS THE MORNING OF SURGERY WITH A SIP OF WATER: ? ?Azathioprine ?Simvastatin ? ?Stop metformin 2 days prior to surgery. Last day to take metformin is Monday, May 8. Resume AFTER surgery. ? ?One week prior to surgery: May 4 ?Stop aspirin, Meloxicam and Anti-inflammatories (NSAIDS) such as Advil, Aleve, Ibuprofen, Motrin, Naproxen, Naprosyn and Aspirin based products such as Excedrin, Goodys Powder, BC Powder. ?Stop ANY OVER THE COUNTER supplements until after surgery. ?You may however, continue to take Tylenol if needed for pain up until the day of surgery. ? ?No Alcohol for 24 hours before or after surgery. ? ?No Smoking including e-cigarettes for 24 hours prior to surgery.  ?No chewable tobacco products for at least 6 hours prior to surgery.  ?No nicotine patches on the day of  surgery. ? ?Do not use any "recreational" drugs for at least a week prior to your surgery.  ?Please be advised that the combination of cocaine and anesthesia may have negative outcomes, up to and including death. ?If you test positive for cocaine, your surgery will be cancelled. ? ?On the morning of surgery brush your teeth with toothpaste and water, you may rinse your mouth with mouthwash if you wish. ?Do not swallow any toothpaste or mouthwash. ? ?Use CHG Soap as directed on instruction sheet. ? ?Do not wear jewelry, make-up, hairpins, clips or nail polish. ? ?Do not wear lotions, powders, or perfumes.  ? ?Do not shave body from the neck down 48 hours prior to surgery just in case you cut yourself which could leave a site for infection.  ?Also, freshly shaved skin may become irritated if using the CHG soap. ? ?Contact lenses, hearing aids and dentures may not be worn into surgery. ? ?Do not bring valuables to the hospital. [facility] is not responsible for any missing/lost belongings or valuables.  ? ?Notify your doctor if there is any change in your medical condition (cold, fever, infection). ? ?Wear comfortable clothing (specific to your surgery type) to the hospital. ? ?After surgery, you can help prevent lung complications by doing breathing exercises.  ?Take deep breaths and cough every 1-2 hours. Your doctor may order a device called an Incentive Spirometer to help you take deep breaths. ? ?If you are being admitted to the hospital overnight, leave your suitcase in the car. ?After surgery it may be  brought to your room. ? ?If you are being discharged the day of surgery, you will not be allowed to drive home. ?You will need a responsible adult (18 years or older) to drive you home and stay with you that night.  ? ?If you are taking public transportation, you will need to have a responsible adult (18 years or older) with you. ?Please confirm with your physician that it is acceptable to use public  transportation.  ? ?Please call the [facility] Dept. at [phone] if you have any questions about these instructions. ? ?Surgery Visitation Policy: ? ?Patients undergoing a surgery or procedure may have two family members or support persons with them as long as the person is not COVID-19 positive or experiencing its symptoms.  ? ?Inpatient Visitation:   ? ?Visiting hours are 7 a.m. to 8 p.m. ?Up to four visitors are allowed at one time in a patient room, including children. The visitors may rotate out with other people during the day. One designated support person (adult) may remain overnight.  ?

## 2022-02-16 MED ORDER — CHLORHEXIDINE GLUCONATE 0.12 % MT SOLN: 15.0000 mL | Freq: Once | OROMUCOSAL | Status: AC

## 2022-02-16 MED ORDER — ORAL CARE MOUTH RINSE: 15.0000 mL | Freq: Once | OROMUCOSAL | Status: AC

## 2022-02-16 MED ORDER — FAMOTIDINE 20 MG PO TABS: 20.0000 mg | ORAL_TABLET | Freq: Once | ORAL | Status: AC

## 2022-02-16 MED ORDER — CEFAZOLIN SODIUM-DEXTROSE 2-4 GM/100ML-% IV SOLN
2.0000 g | INTRAVENOUS | Status: AC
  Administered 2022-02-17: 2 g via INTRAVENOUS

## 2022-02-16 MED ORDER — SODIUM CHLORIDE 0.9 % IV SOLN
INTRAVENOUS | Status: DC
Start: 2022-02-16 — End: 2022-02-17

## 2022-02-17 ENCOUNTER — Ambulatory Visit
Admission: RE | Admit: 2022-02-17 | Discharge: 2022-02-18 | Disposition: A | Discharge status: Home / Self Care | Payer: 59 | Attending: Surgery | Admitting: Surgery

## 2022-02-17 ENCOUNTER — Other Ambulatory Visit: Payer: Self-pay

## 2022-02-17 ENCOUNTER — Ambulatory Visit: Payer: 59

## 2022-02-17 ENCOUNTER — Encounter
Admission: RE | Disposition: A | Discharge status: Home / Self Care | Payer: Self-pay | Source: Home / Self Care | Attending: Surgery

## 2022-02-17 ENCOUNTER — Ambulatory Visit: Payer: 59 | Admitting: Anesthesiology

## 2022-02-17 DIAGNOSIS — M1712 Unilateral primary osteoarthritis, left knee: Principal | ICD-10-CM | POA: Insufficient documentation

## 2022-02-17 DIAGNOSIS — Z96652 Presence of left artificial knee joint: Secondary | ICD-10-CM | POA: Diagnosis present

## 2022-02-17 HISTORY — PX: PARTIAL KNEE ARTHROPLASTY: SHX2174

## 2022-02-17 LAB — GLUCOSE, CAPILLARY
Glucose-Capillary: 168 mg/dL — ABNORMAL HIGH (ref 70–99)
Glucose-Capillary: 250 mg/dL — ABNORMAL HIGH (ref 70–99)

## 2022-02-17 LAB — ABO/RH: ABO/RH(D): B POS

## 2022-02-17 SURGERY — ARTHROPLASTY, KNEE, UNICOMPARTMENTAL
Anesthesia: Spinal | Site: Knee | Laterality: Left

## 2022-02-17 MED ORDER — ONDANSETRON HCL 4 MG/2ML IJ SOLN
INTRAMUSCULAR | Status: DC | PRN
  Administered 2022-02-17: 4 mg via INTRAVENOUS

## 2022-02-17 MED ORDER — POTASSIUM CHLORIDE IN NACL 20-0.9 MEQ/L-% IV SOLN
INTRAVENOUS | Status: DC
  Filled 2022-02-17 (×3): qty 1000

## 2022-02-17 MED ORDER — SODIUM CHLORIDE 0.9 % BOLUS PEDS
250.0000 mL | Freq: Once | INTRAVENOUS | Status: AC
  Administered 2022-02-17: 250 mL via INTRAVENOUS

## 2022-02-17 MED ORDER — ACETAMINOPHEN 500 MG PO TABS
1000.0000 mg | ORAL_TABLET | Freq: Four times a day (QID) | ORAL | Status: AC
  Administered 2022-02-18: 1000 mg via ORAL
  Filled 2022-02-17 (×2): qty 2

## 2022-02-17 MED ORDER — 0.9 % SODIUM CHLORIDE (POUR BTL) OPTIME
TOPICAL | Status: DC | PRN
Start: 2022-02-17 — End: 2022-02-17
  Administered 2022-02-17: 250 mL

## 2022-02-17 MED ORDER — SODIUM CHLORIDE 0.9 % IR SOLN
Status: DC | PRN
Start: 2022-02-17 — End: 2022-02-17
  Administered 2022-02-17: 3000 mL

## 2022-02-17 MED ORDER — SODIUM CHLORIDE FLUSH 0.9 % IV SOLN
INTRAVENOUS | Status: AC
  Filled 2022-02-17: qty 10

## 2022-02-17 MED ORDER — FENTANYL CITRATE (PF) 100 MCG/2ML IJ SOLN
25.0000 ug | INTRAMUSCULAR | Status: DC | PRN
  Administered 2022-02-17: 25 ug via INTRAVENOUS

## 2022-02-17 MED ORDER — ONDANSETRON HCL 4 MG/2ML IJ SOLN: 4.0000 mg | Freq: Once | INTRAMUSCULAR | Status: DC | PRN

## 2022-02-17 MED ORDER — MIDAZOLAM HCL 5 MG/5ML IJ SOLN
INTRAMUSCULAR | Status: DC | PRN
Start: 2022-02-17 — End: 2022-02-17
  Administered 2022-02-17 (×2): .5 mg via INTRAVENOUS

## 2022-02-17 MED ORDER — FENTANYL CITRATE (PF) 100 MCG/2ML IJ SOLN
INTRAMUSCULAR | Status: AC
Start: 2022-02-17 — End: ?
  Filled 2022-02-17: qty 2

## 2022-02-17 MED ORDER — FENTANYL CITRATE (PF) 100 MCG/2ML IJ SOLN
INTRAMUSCULAR | Status: DC | PRN
  Administered 2022-02-17 (×2): 25 ug via INTRAVENOUS

## 2022-02-17 MED ORDER — BUPIVACAINE-EPINEPHRINE (PF) 0.5% -1:200000 IJ SOLN
INTRAMUSCULAR | Status: AC
  Filled 2022-02-17: qty 30

## 2022-02-17 MED ORDER — OXYCODONE HCL 5 MG PO TABS
ORAL_TABLET | ORAL | Status: AC
  Filled 2022-02-17: qty 2

## 2022-02-17 MED ORDER — ESMOLOL HCL 100 MG/10ML IV SOLN
INTRAVENOUS | Status: DC | PRN
  Administered 2022-02-17: 20 mg via INTRAVENOUS
  Administered 2022-02-17: 10 mg via INTRAVENOUS

## 2022-02-17 MED ORDER — BUPIVACAINE LIPOSOME 1.3 % IJ SUSP
INTRAMUSCULAR | Status: AC
  Filled 2022-02-17: qty 20

## 2022-02-17 MED ORDER — OXYCODONE HCL 5 MG/5ML PO SOLN: 5.0000 mg | Freq: Once | ORAL | Status: DC | PRN

## 2022-02-17 MED ORDER — SODIUM CHLORIDE FLUSH 0.9 % IV SOLN
INTRAVENOUS | Status: AC
  Filled 2022-02-17: qty 40

## 2022-02-17 MED ORDER — PROPOFOL 1000 MG/100ML IV EMUL
INTRAVENOUS | Status: AC
  Filled 2022-02-17: qty 100

## 2022-02-17 MED ORDER — KETOROLAC TROMETHAMINE 15 MG/ML IJ SOLN
15.0000 mg | Freq: Once | INTRAMUSCULAR | Status: AC
  Administered 2022-02-17: 15 mg via INTRAVENOUS

## 2022-02-17 MED ORDER — CEFAZOLIN SODIUM-DEXTROSE 2-4 GM/100ML-% IV SOLN
INTRAVENOUS | Status: AC
  Administered 2022-02-17: 2 g via INTRAVENOUS
  Filled 2022-02-17: qty 100

## 2022-02-17 MED ORDER — OXYCODONE HCL 5 MG PO TABS
5.0000 mg | ORAL_TABLET | ORAL | Status: DC | PRN
  Administered 2022-02-17: 10 mg via ORAL
  Administered 2022-02-17 – 2022-02-18 (×4): 5 mg via ORAL
  Filled 2022-02-17 (×4): qty 1

## 2022-02-17 MED ORDER — ONDANSETRON HCL 4 MG PO TABS
4.0000 mg | ORAL_TABLET | Freq: Four times a day (QID) | ORAL | Status: DC | PRN
Start: 2022-02-17 — End: 2022-02-18

## 2022-02-17 MED ORDER — BUPIVACAINE-EPINEPHRINE (PF) 0.5% -1:200000 IJ SOLN
INTRAMUSCULAR | Status: DC | PRN
  Administered 2022-02-17: 30 mL via PERINEURAL

## 2022-02-17 MED ORDER — TRANEXAMIC ACID 1000 MG/10ML IV SOLN
INTRAVENOUS | Status: AC
  Filled 2022-02-17: qty 10

## 2022-02-17 MED ORDER — PHENYLEPHRINE HCL-NACL 20-0.9 MG/250ML-% IV SOLN
INTRAVENOUS | Status: AC
  Filled 2022-02-17: qty 250

## 2022-02-17 MED ORDER — TRANEXAMIC ACID 1000 MG/10ML IV SOLN
INTRAVENOUS | Status: DC | PRN
  Administered 2022-02-17: 2000 mg via TOPICAL

## 2022-02-17 MED ORDER — ONDANSETRON HCL 4 MG/2ML IJ SOLN: 4.0000 mg | Freq: Four times a day (QID) | INTRAMUSCULAR | Status: DC | PRN

## 2022-02-17 MED ORDER — APIXABAN 2.5 MG PO TABS: 2.5000 mg | ORAL_TABLET | Freq: Two times a day (BID) | ORAL | 0 refills | Status: DC

## 2022-02-17 MED ORDER — OXYCODONE HCL 5 MG PO TABS
ORAL_TABLET | ORAL | Status: AC
  Administered 2022-02-17: 10 mg via ORAL
  Filled 2022-02-17: qty 2

## 2022-02-17 MED ORDER — PROPOFOL 500 MG/50ML IV EMUL
INTRAVENOUS | Status: DC | PRN
  Administered 2022-02-17: 60 ug/kg/min via INTRAVENOUS

## 2022-02-17 MED ORDER — MIDAZOLAM HCL 2 MG/2ML IJ SOLN
INTRAMUSCULAR | Status: AC
Start: 2022-02-17 — End: ?
  Filled 2022-02-17: qty 2

## 2022-02-17 MED ORDER — BUPIVACAINE LIPOSOME 1.3 % IJ SUSP
INTRAMUSCULAR | Status: DC | PRN
Start: 2022-02-17 — End: 2022-02-17
  Administered 2022-02-17: 20 mL

## 2022-02-17 MED ORDER — METOCLOPRAMIDE HCL 10 MG PO TABS
5.0000 mg | ORAL_TABLET | Freq: Three times a day (TID) | ORAL | Status: DC | PRN
  Filled 2022-02-17: qty 1

## 2022-02-17 MED ORDER — ACETAMINOPHEN 10 MG/ML IV SOLN
INTRAVENOUS | Status: AC
  Filled 2022-02-17: qty 100

## 2022-02-17 MED ORDER — DEXAMETHASONE SODIUM PHOSPHATE 10 MG/ML IJ SOLN
INTRAMUSCULAR | Status: DC | PRN
  Administered 2022-02-17: 5 mg via INTRAVENOUS

## 2022-02-17 MED ORDER — OXYCODONE HCL 5 MG PO TABS: 5.0000 mg | ORAL_TABLET | Freq: Once | ORAL | Status: DC | PRN

## 2022-02-17 MED ORDER — CEFAZOLIN SODIUM-DEXTROSE 2-4 GM/100ML-% IV SOLN
INTRAVENOUS | Status: AC
  Filled 2022-02-17: qty 100

## 2022-02-17 MED ORDER — CHLORHEXIDINE GLUCONATE 0.12 % MT SOLN
OROMUCOSAL | Status: AC
  Administered 2022-02-17: 15 mL via OROMUCOSAL
  Filled 2022-02-17: qty 15

## 2022-02-17 MED ORDER — KETOROLAC TROMETHAMINE 15 MG/ML IJ SOLN
7.5000 mg | Freq: Four times a day (QID) | INTRAMUSCULAR | Status: DC
  Administered 2022-02-17 – 2022-02-18 (×3): 7.5 mg via INTRAVENOUS
  Filled 2022-02-17 (×3): qty 1

## 2022-02-17 MED ORDER — BUPIVACAINE HCL (PF) 0.5 % IJ SOLN
INTRAMUSCULAR | Status: DC | PRN
  Administered 2022-02-17: 2.5 mL via INTRATHECAL

## 2022-02-17 MED ORDER — FENTANYL CITRATE (PF) 100 MCG/2ML IJ SOLN
INTRAMUSCULAR | Status: AC
  Administered 2022-02-17: 50 ug via INTRAVENOUS
  Filled 2022-02-17: qty 2

## 2022-02-17 MED ORDER — KETOROLAC TROMETHAMINE 15 MG/ML IJ SOLN
INTRAMUSCULAR | Status: AC
  Filled 2022-02-17: qty 1

## 2022-02-17 MED ORDER — OXYCODONE HCL 5 MG PO TABS: 5.0000 mg | ORAL_TABLET | ORAL | 0 refills | Status: DC | PRN

## 2022-02-17 MED ORDER — METOCLOPRAMIDE HCL 5 MG/ML IJ SOLN: 5.0000 mg | Freq: Three times a day (TID) | INTRAMUSCULAR | Status: DC | PRN

## 2022-02-17 MED ORDER — FAMOTIDINE 20 MG PO TABS
ORAL_TABLET | ORAL | Status: AC
  Administered 2022-02-17: 20 mg via ORAL
  Filled 2022-02-17: qty 1

## 2022-02-17 MED ORDER — ACETAMINOPHEN 10 MG/ML IV SOLN
1000.0000 mg | Freq: Once | INTRAVENOUS | Status: DC | PRN
  Administered 2022-02-17: 1000 mg via INTRAVENOUS

## 2022-02-17 MED ORDER — CEFAZOLIN SODIUM-DEXTROSE 2-4 GM/100ML-% IV SOLN
2.0000 g | Freq: Four times a day (QID) | INTRAVENOUS | Status: AC
  Administered 2022-02-17 – 2022-02-18 (×2): 2 g via INTRAVENOUS
  Filled 2022-02-17 (×2): qty 100

## 2022-02-17 SURGICAL SUPPLY — 64 items
BEARING TIB 3 THK LT OXFORD (Miscellaneous) IMPLANT
BIT DRILL QUICK REL 1/8 2PK SL (DRILL) IMPLANT
BNDG ELASTIC 6X5.8 VLCR STR LF (GAUZE/BANDAGES/DRESSINGS) ×2 IMPLANT
CEMENT BONE R 1X40 (Cement) ×2 IMPLANT
CEMENT VACUUM MIXING SYSTEM (MISCELLANEOUS) ×2 IMPLANT
CHLORAPREP W/TINT 26 (MISCELLANEOUS) ×4 IMPLANT
COOLER POLAR GLACIER W/PUMP (MISCELLANEOUS) ×2 IMPLANT
COVER MAYO STAND REUSABLE (DRAPES) ×2 IMPLANT
CUFF TOURN SGL QUICK 24 (TOURNIQUET CUFF)
CUFF TOURN SGL QUICK 34 (TOURNIQUET CUFF)
CUFF TRNQT CYL 24X4X16.5-23 (TOURNIQUET CUFF) IMPLANT
CUFF TRNQT CYL 34X4.125X (TOURNIQUET CUFF) IMPLANT
DRAPE C-ARM XRAY 36X54 (DRAPES) ×1 IMPLANT
DRAPE U-SHAPE 47X51 STRL (DRAPES) ×4 IMPLANT
DRILL QUICK RELEASE 1/8 INCH (DRILL) ×1
DRSG MEPILEX SACRM 8.7X9.8 (GAUZE/BANDAGES/DRESSINGS) ×2 IMPLANT
DRSG OPSITE POSTOP 4X12 (GAUZE/BANDAGES/DRESSINGS) ×1 IMPLANT
DRSG OPSITE POSTOP 4X6 (GAUZE/BANDAGES/DRESSINGS) ×2 IMPLANT
ELECT CAUTERY BLADE 6.4 (BLADE) ×2 IMPLANT
ELECT REM PT RETURN 9FT ADLT (ELECTROSURGICAL) ×2
ELECTRODE REM PT RTRN 9FT ADLT (ELECTROSURGICAL) ×1 IMPLANT
GAUZE SPONGE 4X4 12PLY STRL (GAUZE/BANDAGES/DRESSINGS) ×2 IMPLANT
GAUZE XEROFORM 1X8 LF (GAUZE/BANDAGES/DRESSINGS) ×2 IMPLANT
GLOVE BIO SURGEON STRL SZ7.5 (GLOVE) ×8 IMPLANT
GLOVE BIO SURGEON STRL SZ8 (GLOVE) ×8 IMPLANT
GLOVE BIOGEL PI IND STRL 8 (GLOVE) ×1 IMPLANT
GLOVE BIOGEL PI INDICATOR 8 (GLOVE) ×1
GLOVE SURG UNDER LTX SZ8 (GLOVE) ×2 IMPLANT
GOWN STRL REUS W/ TWL LRG LVL3 (GOWN DISPOSABLE) ×1 IMPLANT
GOWN STRL REUS W/ TWL XL LVL3 (GOWN DISPOSABLE) ×1 IMPLANT
GOWN STRL REUS W/TWL LRG LVL3 (GOWN DISPOSABLE) ×1
GOWN STRL REUS W/TWL XL LVL3 (GOWN DISPOSABLE) ×1
HOOD PEEL AWAY FLYTE STAYCOOL (MISCELLANEOUS) ×6 IMPLANT
INSERT TIBIAL OXFORD SZ B LF (Joint) ×1 IMPLANT
IV NS IRRIG 3000ML ARTHROMATIC (IV SOLUTION) ×2 IMPLANT
KIT TURNOVER KIT A (KITS) ×2 IMPLANT
KNEE PARTIAL CEMENT FEM XS (Miscellaneous) ×1 IMPLANT
KNEE PARTIAL MENISCAL XS LT (Miscellaneous) ×2 IMPLANT
MANIFOLD NEPTUNE II (INSTRUMENTS) ×2 IMPLANT
MAT ABSORB  FLUID 56X50 GRAY (MISCELLANEOUS) ×1
MAT ABSORB FLUID 56X50 GRAY (MISCELLANEOUS) ×1 IMPLANT
NDL SAFETY ECLIPSE 18X1.5 (NEEDLE) ×1 IMPLANT
NDL SPNL 20GX3.5 QUINCKE YW (NEEDLE) ×1 IMPLANT
NEEDLE HYPO 18GX1.5 SHARP (NEEDLE) ×1
NEEDLE SPNL 20GX3.5 QUINCKE YW (NEEDLE) ×2 IMPLANT
NS IRRIG 1000ML POUR BTL (IV SOLUTION) ×2 IMPLANT
PACK BLADE SAW RECIP 70 3 PT (BLADE) ×1 IMPLANT
PACK TOTAL KNEE (MISCELLANEOUS) ×2 IMPLANT
PAD ABD DERMACEA PRESS 5X9 (GAUZE/BANDAGES/DRESSINGS) ×4 IMPLANT
PAD WRAPON POLAR KNEE (MISCELLANEOUS) ×1 IMPLANT
PULSAVAC PLUS IRRIG FAN TIP (DISPOSABLE) ×2
STAPLER SKIN PROX 35W (STAPLE) ×2 IMPLANT
STRAP SAFETY 5IN WIDE (MISCELLANEOUS) ×2 IMPLANT
SUCTION FRAZIER HANDLE 10FR (MISCELLANEOUS) ×1
SUCTION TUBE FRAZIER 10FR DISP (MISCELLANEOUS) ×1 IMPLANT
SUT VIC AB 0 CT1 36 (SUTURE) ×2 IMPLANT
SUT VIC AB 2-0 CT1 27 (SUTURE) ×4
SUT VIC AB 2-0 CT1 TAPERPNT 27 (SUTURE) ×4 IMPLANT
SYR 10ML LL (SYRINGE) ×2 IMPLANT
SYR 30ML LL (SYRINGE) ×4 IMPLANT
TAPE TRANSPORE STRL 2 31045 (GAUZE/BANDAGES/DRESSINGS) ×2 IMPLANT
TIP FAN IRRIG PULSAVAC PLUS (DISPOSABLE) ×1 IMPLANT
WATER STERILE IRR 500ML POUR (IV SOLUTION) ×2 IMPLANT
WRAPON POLAR PAD KNEE (MISCELLANEOUS) ×2

## 2022-02-17 NOTE — Anesthesia Procedure Notes (Signed)
Procedure Name: General with mask airway ?Date/Time: 02/17/2022 8:02 AM ?Performed by: Mohammed Kindle, CRNA ?Pre-anesthesia Checklist: Patient identified, Emergency Drugs available, Suction available and Patient being monitored ?Patient Re-evaluated:Patient Re-evaluated prior to induction ?Oxygen Delivery Method: Simple face mask ?Induction Type: IV induction ?Placement Confirmation: positive ETCO2, CO2 detector and breath sounds checked- equal and bilateral ?Dental Injury: Teeth and Oropharynx as per pre-operative assessment  ? ? ? ? ?

## 2022-02-17 NOTE — Evaluation (Signed)
Physical Therapy Evaluation ?Patient Details ?Name: Judy Barber ?MRN: [id number] ?DOB: 22-Feb-1956 ?Today's Date: 02/17/2022 ? ?History of Present Illness ? Patient is a 66 year old female with osteoarthritis of medial compartment, left knee. Status post left unicondylar knee arthroplasty.  ?Clinical Impression ? Patient was agreeable to PT. She reports she was independent at baseline and works at [employer] as a Quarry manager. She lives with her niece.  ?Mobility initiated with gait training and therapeutic exercises. No physical assistance required for ambulating in hallway with rolling walker. She needed minimal assistance for standing from a low toilet but can stand from the bed with supervision. Exercises initiated for strengthening. Recommend to continue PT to maximize independence and facilitate return to prior level of function. She is requesting to stay in the hospital tonight and wants to be discharge home tomorrow.  ?   ? ?Recommendations for follow up therapy are one component of a multi-disciplinary discharge planning process, led by the attending physician.  Recommendations may be updated based on patient status, additional functional criteria and insurance authorization. ? ?Follow Up Recommendations Outpatient PT ? ?  ?Assistance Recommended at Discharge Intermittent Supervision/Assistance  ?Patient can return home with the following ? A little help with walking and/or transfers;A little help with bathing/dressing/bathroom;Assist for transportation;Help with stairs or ramp for entrance ? ?  ?Equipment Recommendations Rolling walker (2 wheels);BSC/3in1  ?Recommendations for Other Services ?    ?  ?Functional Status Assessment Patient has had a recent decline in their functional status and demonstrates the ability to make significant improvements in function in a reasonable and predictable amount of time.  ? ?  ?Precautions / Restrictions Precautions ?Precautions: Knee ?Precaution Booklet Issued: Yes  (comment) ?Restrictions ?Weight Bearing Restrictions: Yes ?LLE Weight Bearing: Weight bearing as tolerated  ? ?  ? ?Mobility ? Bed Mobility ?Overal bed mobility: Needs Assistance ?Bed Mobility: Supine to Sit, Sit to Supine ?  ?  ?Supine to sit: Min guard ?Sit to supine: Min guard ?  ?General bed mobility comments: cues for technique. patient required increased time and effort for bed mobility ?  ? ?Transfers ?Overall transfer level: Needs assistance ?Equipment used: Rolling walker (2 wheels) ?Transfers: Sit to/from Stand ?Sit to Stand: Supervision, Min assist, From elevated surface ?  ?  ?  ?  ?  ?General transfer comment: bed height elevated per patient request. no physical assistance for standing from bed. Min A for standing from lower surface of toilet with verbal cues for hand placement. patient was educated on car transfer techniques as well. ?  ? ?Ambulation/Gait ?Ambulation/Gait assistance: Min guard ?Gait Distance (Feet): 75 Feet ?Assistive device: Rolling walker (2 wheels) ?Gait Pattern/deviations: Decreased stance time - left, Decreased stride length ?Gait velocity: decreased ?  ?  ?General Gait Details: verbal cues for use of rolling walker for support with ambulation. no loss of balance or knee buckling noted ? ?Stairs ?  ?  ?  ?  ?  ? ?Wheelchair Mobility ?  ? ?Modified Rankin (Stroke Patients Only) ?  ? ?  ? ?Balance Overall balance assessment: Needs assistance ?Sitting-balance support: Feet supported ?Sitting balance-Leahy Scale: Good ?  ?  ?Standing balance support: Bilateral upper extremity supported, Reliant on assistive device for balance ?Standing balance-Leahy Scale: Fair ?Standing balance comment: no loss of balance with rolling walker for UE support ?  ?  ?  ?  ?  ?  ?  ?  ?  ?  ?  ?   ? ? ? ?  Pertinent Vitals/Pain Pain Assessment ?Pain Assessment: 0-10 ?Pain Score: 10-Worst pain ever (she reports pain is "about a 10", but is able to carry on a conversation and participate with therapy) ?Pain  Location: left knee ?Pain Descriptors / Indicators: Discomfort, Grimacing ?Pain Intervention(s): Limited activity within patient's tolerance, Monitored during session, Repositioned, Ice applied  ? ? ?Home Living Family/patient expects to be discharged to:: Private residence ?Living Arrangements: Other (Comment) (niece) ?Available Help at Discharge: Family ?Type of Home: Apartment ?Home Access: Ramped entrance ?  ?  ?  ?Home Layout: One level ?Home Equipment: Standard Walker ?   ?  ?Prior Function Prior Level of Function : Independent/Modified Independent;Working/employed ?  ?  ?  ?  ?  ?  ?Mobility Comments: independent, works at [employer] ?ADLs Comments: independent ?  ? ? ?Hand Dominance  ?   ? ?  ?Extremity/Trunk Assessment  ? Upper Extremity Assessment ?Upper Extremity Assessment: Overall WFL for tasks assessed ?  ? ?Lower Extremity Assessment ?Lower Extremity Assessment: RLE deficits/detail;LLE deficits/detail ?RLE Deficits / Details: WFL ?LLE Deficits / Details: patient able to SLR independently. no knee buckling with weight bearing. patient able to activate hip/knee/ankle movement ?LLE Sensation: WNL ?  ? ?   ?Communication  ?    ?Cognition Arousal/Alertness: Awake/alert ?Behavior During Therapy: [facility] for tasks assessed/performed ?Overall Cognitive Status: Within Functional Limits for tasks assessed ?  ?  ?  ?  ?  ?  ?  ?  ?  ?  ?  ?  ?  ?  ?  ?  ?General Comments: patient is able to follow all commands without difficulty ?  ?  ? ?  ?General Comments   ? ?  ?Exercises Total Joint Exercises ?Ankle Circles/Pumps: AROM, Strengthening, Left, 10 reps, Supine ?Quad Sets: AROM, Strengthening, Left, 5 reps, Supine ?Hip ABduction/ADduction: AAROM, Strengthening, Left, 5 reps, Supine ?Straight Leg Raises: AROM, Strengthening, Left, 5 reps, Supine ?Goniometric ROM: left knee 5-71 degrees ?Other Exercises ?Other Exercises: verbal cues for exercise technique per HEP packet  ? ?Assessment/Plan  ?  ?PT Assessment Patient  needs continued PT services  ?PT Problem List Decreased range of motion;Decreased strength;Decreased activity tolerance;Decreased balance;Decreased mobility;Pain;Decreased safety awareness;Decreased knowledge of precautions ? ?   ?  ?PT Treatment Interventions DME instruction;Gait training;Stair training;Functional mobility training;Therapeutic activities;Therapeutic exercise;Balance training;Neuromuscular re-education;Patient/family education   ? ?PT Goals (Current goals can be found in the Care Plan section)  ?Acute Rehab PT Goals ?Patient Stated Goal: to go home tomorrow ?PT Goal Formulation: With patient ?Time For Goal Achievement: 03/03/22 ?Potential to Achieve Goals: Good ? ?  ?Frequency BID ?  ? ? ?Co-evaluation   ?  ?  ?  ?  ? ? ?  ?AM-PAC PT "6 Clicks" Mobility  ?Outcome Measure Help needed turning from your back to your side while in a flat bed without using bedrails?: None ?Help needed moving from lying on your back to sitting on the side of a flat bed without using bedrails?: A Little ?Help needed moving to and from a bed to a chair (including a wheelchair)?: A Little ?Help needed standing up from a chair using your arms (e.g., wheelchair or bedside chair)?: A Little ?Help needed to walk in hospital room?: A Little ?Help needed climbing 3-5 steps with a railing? : A Little ?6 Click Score: 19 ? ?  ?End of Session Equipment Utilized During Treatment: Gait belt ?Activity Tolerance: Patient tolerated treatment well ?Patient left: in bed;with call bell/phone within reach (polar care re-applied) ?  Nurse Communication: Mobility status ?PT Visit Diagnosis: Muscle weakness (generalized) (M62.81);Other abnormalities of gait and mobility (R26.89);Pain ?Pain - Right/Left: Left ?Pain - part of body: Knee ?  ? ?Time: [id number] ?PT Time Calculation (min) (ACUTE ONLY): 33 min ? ? ?Charges:   PT Evaluation ?$PT Eval Low Complexity: 1 Low ?PT Treatments ?$Therapeutic Exercise: 8-22 mins ?  ?   ? ? ?Minna Merritts, PT,  MPT ? ? ?Percell Locus ?02/17/2022, 4:00 PM ? ?

## 2022-02-17 NOTE — H&P (Signed)
History of Present Illness: ?Judy Barber is a 66 y.o.female who is being seen in consultation at the request of Dr. Posey Pronto for left knee pain. The symptoms began in November, 2022, and developed as a result of an injury. Apparently her knee buckled while coming out of a store. She went to the urgent care clinic where she was told she had a hamstring injury. She was kept out of work for several weeks. However, because of continued symptoms, she saw Reche Dixon, PA-C, who diagnosed her with a meniscus tear. He sent her for an MRI scan, then referred her to Dr. Posey Pronto. Dr. Posey Pronto treated her with a steroid injection which provided only limited benefit. Based on her MRI scan which demonstrated a posterior root tear of the medial meniscus as well as significant degenerative changes involving the medial compartment, the patient was not felt to be a candidate for a meniscal repair so she has been referred to me for consideration of a partial knee replacement. She reports 10/10 pain. The pain is located along the medial aspect of the knee. The pain is described as aching, dull, stabbing and throbbing. The symptoms are aggravated with normal daily activities, at rest, using stairs, at higher levels of activity, walking, standing, standing pivot and activity in general. She notes that she frequently will walk with a limp due to the pain, but has not been using any assistive devices. She describes difficulty reciprocating stairs. She also describes occasional episodes of giving way. She has associated swelling and no deformity. She has tried over-the-counter medications, anti-inflammatories, steroid injections, a home exercise program, ice and heat with limited benefit. She works as a Environmental consultant, but notes that performing her job has been difficult as it requires her to be on her feet for extended periods of time. ? ?Current Outpatient Medications: ? armodafiniL (NUVIGIL) 50 mg tablet Take 1 tablet (50 mg total) by mouth once  daily for 30 days (Patient not taking: Reported on 11/30/2021) 90 tablet 0  ? aspirin 81 MG EC tablet Take 81 mg by mouth once daily.  ? azaTHIOprine (IMURAN) 50 mg tablet Take 1 tablet (50 mg total) by mouth once daily 30 tablet 11  ? benzonatate (TESSALON) 100 MG capsule Take 1 capsule (100 mg total) by mouth 3 (three) times daily  ? blood glucose diagnostic test strip Use 1 each (1 strip total) 2 (two) times daily E11.9 Use as instructed. 100 each 12  ? blood glucose meter kit Use as directed 1 each 0  ? celecoxib (CELEBREX) 200 MG capsule Take 1 capsule (200 mg total) by mouth 2 (two) times daily for 60 days 60 capsule 1  ? cyclobenzaprine (FLEXERIL) 5 MG tablet 1-2 every 8 hours for muscle spasm, sedating 20 tablet 0  ? docusate (COLACE) 100 MG capsule Take 100 mg by mouth once daily.  ? ? ERGOCALCIFEROL, VITAMIN D2, (VITAMIN D3 ORAL) Take 1,000 Units by mouth every morning  ? glimepiride (AMARYL) 4 MG tablet Take 1 tablet (4 mg total) by mouth 2 (two) times daily 180 tablet 1  ? lancing device with lancets kit Use 1 each 2 (two) times daily 100 each 12  ? losartan (COZAAR) 25 MG tablet Take 1 tablet (25 mg total) by mouth once daily for 338 days 90 tablet 1  ? metFORMIN (GLUCOPHAGE-XR) 750 MG XR tablet Take 1 tablet (750 mg total) by mouth 2 (two) times daily with meals (Patient taking differently: Take 500 mg by mouth 2 (two) times  daily with meals) 60 tablet 11  ? simvastatin (ZOCOR) 20 MG tablet Take 1 tablet (20 mg total) by mouth nightly for 242 days 90 tablet 1  ? ?Allergies:  ? Bee Venom Protein (Honey Bee) Swelling  ? Venom-Honey Bee Swelling  ? Wasp Venom Swelling  ? Yellow Jacket Venom Swelling  ? ?Past Medical History:  ? Diabetes mellitus without complication (CMS-HCC)  ? Hyperlipidemia  ? Hypertension  ? Primary osteoarthritis of left knee 10/19/2021  ? 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 Strabismus  ? ?Past Surgical History:  ? APPENDECTOMY 1980 (ruptured appendix)  ? BREAST EXCISIONAL BIOPSY Right  2013 (benign)  ? Left breast biopsy 04/2016  ? ARTHROSCOPY KNEE W/MENISCECTOMY Right 03/01/2017  ?Procedure: ARTHROSCOPY, KNEE W/ MENISCECTOMY INCL DEBRIDEMENT/SHAVING ARTICULAR CARTILAGE (CHONDROPLASTY); Surgeon: Margret Chance, MD; Location: [facility]; Service: Orthopedics; Laterality: Right;  ? COLONOSCOPY W/BIOPSY N/A 08/21/2018  ?Procedure: COLONOSCOPY, FLEXIBLE; WITH BIOPSY, SINGLE OR MULTIPLE; Surgeon: Don Perking, MD; Location: [facility]; Service: Gastroenterology; Laterality: N/A;  ? ABDOMINAL SURGERY (tubal ligation)  ? CARDIAC SURGERY (ablation in early 2000s)  ? PERCUTANEOUS BIOPSY LUNG (bx 2005 diagnosis of sarcoidosis)  ? TUBAL LIGATION  ? ?Family History:  ? Diabetes type II Mother  ? Diabetes type II Father  ? Stroke Father  ? Breast cancer Cousin  ? Ovarian cancer Cousin  ? Glaucoma Neg Hx  ? ?Social History:  ? ?Socioeconomic History:  ? Marital status: Divorced  ?Tobacco Use  ? Smoking status: Former  ?Packs/day: 0.75  ?Years: 26.00  ?Pack years: 19.50  ?Types: Cigarettes  ?Quit date: 10/11/2003  ?Years since quitting: 18.3  ? Smokeless tobacco: Never  ?Vaping Use  ? Vaping Use: Never used  ?Substance and Sexual Activity  ? Alcohol use: No  ? Drug use: No  ? 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 ?Review of Systems:  ?A comprehensive 14 point ROS was performed, reviewed, and the pertinent orthopaedic findings are documented in the HPI. ? ?Physical Exam: ?Vitals:  ?02/02/22 0849  ?BP: 138/74  ?Weight: 91.8 kg (202 lb 6.4 oz)  ?Height: 160 cm (5' 3" )  ?PainSc: 10-Worst pain ever  ?PainLoc: Knee  ? ?General/Constitutional: Pleasant overweight middle-aged female in no acute distress. ?Neuro/Psych: Normal mood and affect, oriented to person, place and time. ?Eyes: Non-icteric. Pupils are equal, round, and reactive to light, and exhibit synchronous  movement. ?Lymphatic: No palpable adenopathy. ?Respiratory: Lungs clear to auscultation, Normal chest excursion, No wheezes and Non-labored breathing ?Cardiovascular: Regular rate and rhythm. No murmurs. and No edema, swelling or tenderness, except as noted in detailed exam. ?Vascular: No edema, swelling or tenderness, except as noted in detailed exam. ?Integumentary: No impressive skin lesions present, except as noted in detailed exam. ?Musculoskeletal: Unremarkable, except as noted in detailed exam. ? ?Left knee exam: ?GAIT: mild limp and uses no assistive devices. ?ALIGNMENT: normal ?SKIN: unremarkable ?SWELLING: minimal ?EFFUSION: trace ?WARMTH: no warmth ?TENDERNESS: moderate over the medial joint line ?ROM: 0 to 140 degrees with pain in maximal flexion ?McMURRAY'S: positive ?PATELLOFEMORAL: normal tracking with no peri-patellar tenderness and negative apprehension sign ?CREPITUS: no ?LACHMAN'S: negative ?PIVOT SHIFT: negative ?ANTERIOR DRAWER: negative ?POSTERIOR DRAWER: negative ?VARUS/VALGUS: stable ? ?She is neurovascularly intact to the left lower extremity and foot. ? ?Left Knee Imaging: ?A recent MRI scan of the left knee is available for review and has been reviewed by myself. By  report, the scan demonstrates evidence of significant degenerative changes of the posterior portion of the medial meniscus with a radial tear of the root and subsequent medial extrusion of the meniscus. There also is evidence of extensive partial-thickness articular cartilage loss with areas of full-thickness articular cartilage loss involving the medial compartment with medial tibial subchondral bone marrow edema. The lateral patellofemoral compartments appear to be well-maintained. There is no evidence for lateral meniscus tear and no evidence for anterior posterior cruciate ligament pathology. Both the films and report were reviewed by myself and discussed with the patient and her daughter. ? ?Assessment:  ? Primary  osteoarthritis of left knee.  ? Complex tear of medial meniscus of left knee.  ? ?Plan: ?The treatment options were discussed with the patient and her daughter. In addition, patient educational materials were provided regardin

## 2022-02-17 NOTE — Op Note (Signed)
02/17/2022 ? ?9:43 AM ? ?Patient:   Judy Barber ? ?Pre-Op Diagnosis:   Osteoarthritis of medial compartment, left knee. ? ?Post-Op Diagnosis:   Same ? ?Procedure:   Left unicondylar knee arthroplasty. ? ?Surgeon:   Pascal Lux, MD ? ?Assistant:   Cameron Proud, PA-C ? ?Anesthesia:   Spinal ? ?Findings:   As above. ? ?Complications:   None ? ?EBL:   5 cc ? ?Fluids:   500 cc crystalloid ? ?UOP:   None ? ?TT:   75 minutes at 300 mmHg ? ?Drains:   None ? ?Closure:   Staples ? ?Implants:   All-cemented Biomet Oxford system with an extra small femoral component, a "B" sized tibial tray, and a 3 mm meniscal bearing insert. ? ?Brief Clinical Note:   The patient is a 66 year old female with a history of progressive worsening medial sided left knee pain. Her symptoms have progressed despite medications, activity modification, steroid injections, etc. Her history and examination are consistent with a medial meniscus root tear with underlying degenerative joint disease involving the medial portion of her left knee, all of which were confirmed by preoperative MRI scanning. The patient presents at this time for a left partial knee replacement. ? ?Procedure:   The patient was brought into the operating room and a spinal placed by the anesthesiologist. The patient was lain in the supine position and a Foley catheter inserted. The patient was repositioned so that the non-surgical leg was placed in a flexed and abducted position in the yellow fin leg holder while the surgical extremity was placed over the Biomet leg holder. The left lower extremity was prepped with ChloraPrep solution before being draped sterilely. Preoperative antibiotics were administered. After performing a timeout to verify the appropriate surgical site, the limb was exsanguinated with an Esmarch and the tourniquet inflated to 300 mmHg.  ? ?A standard anterior approach to the knee was made through an approximately 3.5-4 inch incision. The incision was carried  down through the subcutaneous tissues to expose the superficial retinaculum. This was split the length the incision and the medial flap elevated sufficiently to expose the medial retinaculum. The medial retinaculum was incised along the medial border of the patella tendon and extended proximally along the medial border of the patella, leaving a 3-4 mm cuff of tissue. The soft tissues were elevated off the anteromedial aspect of the proximal tibia. The anterior portion of the meniscus was removed after performing a subtotal excision of the infrapatellar fat pad. The anterior cruciate ligament was inspected and found to be in excellent condition. Osteophytes were removed from the inferior pole of the patella as well as from the notch using a quarter-inch osteotome. There were significant degenerative changes of both the femur and tibia on the medial side. The medial femoral condyle was sized using the small and extra small sizers. It was felt that the extra small guide best optimized the contour of the femur. This was left in place and the external tibial guide positioned. The coupling device was used to connect the guide to the medial femoral condylar sizer to optimize appropriate orientation. Two guide pins were inserted into the cutting block before the coupling device and sizer were removed. The appropriate tibial cut was made using the oscillating and reciprocating saws. The piece was removed in its entirety and taken to the back table where it was sized and found to be optimally replicated by a "B" sized component. The 9 mm spacer was inserted to verify that  sufficient bone had been removed. ? ?Attention was directed to femoral side. The intramedullary canal was accessed through a 4 mm drill hole. The intramedullary guide was positioned before the guide for the femoral condylar holes was positioned. The appropriate coupling device connected this guide to the intramedullary guide before both drill holes were  created in the distal aspect of the medial femoral condyle. The devices were removed and the posterior condylar cutting block inserted. The appropriate cut was made using the reciprocating saw and this piece removed. The #0 spigot was inserted and the initial bone milling performed. A trial femoral component was inserted and both the flexion and extension gaps measured. In flexion, the gap measured 7 mm whereas in extension, it measured 3 mm. Therefore, the #4 spigot was selected and the secondary bone milling performed. Repeat sizing demonstrated symmetric flexion and extension gaps. The bone was removed from the postero-medial and postero-lateral aspects of the femoral condyle, as well as from the beneath the collar of the spigot. Bone also was removed from the anterior portion of the femur so as to minimize any potential impingement with the meniscal bearing insert. The trial components removed and several drill holes placed into the distal femoral condyle to further augment cement fixation. ? ?Attention was redirected to the tibial side. The "B" sized tibial tray was positioned and temporarily secured using the appropriate spiked nail. The keel was created using the bi-bladed reciprocating saw and hoe. The keeled "B" sized trial tibial tray was inserted to be sure that it seated properly. At this point, a total of 20 cc of Exparel diluted out to 60 cc with normal saline and 30 cc of 0.5% Sensorcaine was injected in and around the posterior and medial capsular tissues, as well as the peri-incisional tissues to help with postoperative pain control. ? ?The bony surfaces were prepared for cementing by irrigating them thoroughly with bacitracin saline solution using the jet lavage system before packing them with a dry Ray-Tec sponge. Meanwhile, cement was being mixed on the back table. When the cement was ready, the tibial tray was cemented in first. The excess cement was removed using a Surveyor, quantity after  impacting it into place. Next, the femoral component was impacted into place. Again the excess cement was removed using a Surveyor, quantity. The 4 mm spacer was inserted and the knee brought into near full extension while the cement hardened. Once the cement hardened, the spacer was removed and the 3 mm and 4 mm meniscal bearing inserts were trialed. The 4 mm bearing was too tight, but the 3 mm trial bearing demonstrated excellent tracking while the knee was placed through a range of motion, and showed no evidence towards subluxation or dislocation. In addition, it did not fit too tightly. Therefore, the permanent 3 mm meniscal bearing insert was snapped into position after verifying that no cement had been retained posteriorly. Again the knee was placed through a range of motion with the findings as described above. ? ?The wound was copiously irrigated with sterile saline solution via the jet lavage system before the retinacular layer was reapproximated using #0 Vicryl interrupted sutures. At this point, 1 g of transexemic acid in 10 cc of normal saline was injected intra-articularly. The subcutaneous tissues were closed in two layers using 2-0 Vicryl interrupted sutures before the skin was closed using staples. A sterile occlusive dressing was applied to the knee before the patient was awakened. The patient was transferred back to his/her hospital bed and returned  to the recovery room in satisfactory condition after tolerating the procedure well. A Polar Care device was applied to the knee as well. ?

## 2022-02-17 NOTE — Discharge Instructions (Signed)
Orthopedic discharge instructions: ?May shower with intact OpSite dressing. ?Apply ice frequently to knee or use Polar Care device. ?Start Eliquis 2.5 mg twice daily for 2 weeks on Friday, 02/18/2022, then take aspirin 325 mg twice daily for 4 weeks. ?Take pain medication as prescribed or ES Tylenol when needed.  ?May weight-bear as tolerated on left leg - use walker for balance and support. ?Follow-up in 10-14 days or as scheduled. ?

## 2022-02-17 NOTE — Transfer of Care (Signed)
Immediate Anesthesia Transfer of Care Note ? ?Patient: Judy Barber ? ?Procedure(s) Performed: UNICOMPARTMENTAL KNEE (Left: Knee) ? ?Patient Location: PACU ? ?Anesthesia Type:Spinal ? ?Level of Consciousness: awake, drowsy and patient cooperative ? ?Airway & Oxygen Therapy: Patient Spontanous Breathing and Patient connected to face mask oxygen ? ?Post-op Assessment: Report given to RN and Post -op Vital signs reviewed and stable ? ?Post vital signs: Reviewed and stable ? ?Last Vitals:  ?Vitals Value Taken Time  ?BP 142/64 02/17/22 1000  ?Temp    ?Pulse 98 02/17/22 1001  ?Resp 21 02/17/22 1001  ?SpO2 98 % 02/17/22 1001  ?Vitals shown include unvalidated device data. ? ?Last Pain:  ?Vitals:  ? 02/17/22 0625  ?TempSrc: Temporal  ?PainSc: 5   ?   ? ?  ? ?Complications: No notable events documented. ?

## 2022-02-17 NOTE — Progress Notes (Cosign Needed)
Patient is not able to walk the distance required to go the bathroom, or he/she is unable to safely negotiate stairs required to access the bathroom.  A 3in1 BSC will alleviate this problem  

## 2022-02-17 NOTE — Anesthesia Procedure Notes (Signed)
Spinal ? ?Patient location during procedure: OR ?Start time: 02/17/2022 7:39 AM ?End time: 02/17/2022 7:41 AM ?Reason for block: surgical anesthesia ?Staffing ?Performed: resident/CRNA  ?Anesthesiologist: Arita Miss, MD ?Resident/CRNA: Aline Brochure, CRNA ?Preanesthetic Checklist ?Completed: patient identified, IV checked, site marked, risks and benefits discussed, surgical consent, monitors and equipment checked, pre-op evaluation and timeout performed ?Spinal Block ?Patient position: sitting ?Prep: DuraPrep ?Patient monitoring: heart rate, cardiac monitor, continuous pulse ox and blood pressure ?Approach: midline ?Location: L3-4 ?Injection technique: single-shot ?Needle ?Needle type: Pencan and Introducer  ?Needle gauge: 24 G ?Needle length: 9 cm ?Assessment ?Sensory level: T4 ?Events: CSF return ? ? ? ?

## 2022-02-17 NOTE — Anesthesia Preprocedure Evaluation (Signed)
Anesthesia Evaluation  ?Patient identified by MRN, date of birth, ID band ?Patient awake ? ? ? ?Reviewed: ?Allergy & Precautions, NPO status , Patient's Chart, lab work & pertinent test results ? ?History of Anesthesia Complications ?Negative for: history of anesthetic complications ? ?Airway ?Mallampati: II ? ?TM Distance: >3 FB ?Neck ROM: Full ? ? ? Dental ? ?(+) Edentulous Upper, Edentulous Lower ?  ?Pulmonary ?sleep apnea , neg COPD, Patient abstained from smoking.Not current smoker, former smoker,  ?  ?Pulmonary exam normal ?breath sounds clear to auscultation ? ? ? ? ? ? Cardiovascular ?Exercise Tolerance: Good ?METShypertension, Pt. on medications ?(-) CAD and (-) Past MI (-) dysrhythmias  ?Rhythm:Regular Rate:Normal ?- Systolic murmurs ? ?  ?Neuro/Psych ?negative neurological ROS ? negative psych ROS  ? GI/Hepatic ?neg GERD  ,(+)  ?  ? (-) substance abuse ? ,   ?Endo/Other  ?diabetes, Well Controlled ? Renal/GU ?negative Renal ROS  ? ?  ?Musculoskeletal ? ? Abdominal ?  ?Peds ? Hematology ?  ?Anesthesia Other Findings ?Past Medical History: ?No date: Diabetes mellitus type 2, controlled (HCC) ?No date: History of kidney stones ?No date: Hyperlipidemia ?No date: Hypertension ?No date: Osteoarthritis of both knees ?No date: Pneumonia ?No date: Pulmonary sarcoidosis (HCC) ?No date: Sleep apnea ?No date: Vitamin D deficiency ? Reproductive/Obstetrics ? ?  ? ? ? ? ? ? ? ? ? ? ? ? ? ?  ?  ? ? ? ? ? ? ? ? ?Anesthesia Physical ?Anesthesia Plan ? ?ASA: 2 ? ?Anesthesia Plan: Spinal  ? ?Post-op Pain Management: Ofirmev IV (intra-op)*  ? ?Induction: Intravenous ? ?PONV Risk Score and Plan: 2 and Ondansetron, Dexamethasone, Propofol infusion, TIVA, Midazolam and Treatment may vary due to age or medical condition ? ?Airway Management Planned: Natural Airway ? ?Additional Equipment: None ? ?Intra-op Plan:  ? ?Post-operative Plan:  ? ?Informed Consent: I have reviewed the patients History  and Physical, chart, labs and discussed the procedure including the risks, benefits and alternatives for the proposed anesthesia with the patient or authorized representative who has indicated his/her understanding and acceptance.  ? ? ? ? ? ?Plan Discussed with: CRNA and Surgeon ? ?Anesthesia Plan Comments: (Discussed R/B/A of neuraxial anesthesia technique with patient: ?- rare risks of spinal/epidural hematoma, nerve damage, infection ?- Risk of PDPH ?- Risk of nausea and vomiting ?- Risk of conversion to general anesthesia and its associated risks, including sore throat, damage to lips/eyes/teeth/oropharynx, and rare risks such as cardiac and respiratory events. ?- Risk of allergic reactions ? ?Discussed the role of CRNA in patient's perioperative care. ? ?Patient voiced understanding.)  ? ? ? ? ? ? ?Anesthesia Quick Evaluation ? ?

## 2022-02-17 NOTE — TOC Progression Note (Signed)
Transition of Care (TOC) - Progression Note  ? ? ?Patient Details  ?Name: Judy Barber ?MRN: [id number] ?Date of Birth: [birth year] ? ?Transition of Care (TOC) CM/SW Contact  ?Marlowe Sax, RN ?Phone Number: ?02/17/2022, 10:34 AM ? ?Clinical Narrative:    ? ?3 in 1 and RW will be delivered to the bedside by [employer] ?She will not be getting HH services as [employer] is not in network with Home Health agencies, Dr aware ? ?  ?  ? ?Expected Discharge Plan and Services ?  ?  ?  ?  ?  ?                ?  ?  ?  ?  ?  ?  ?  ?  ?  ?  ? ? ?Social Determinants of Health (SDOH) Interventions ?  ? ?Readmission Risk Interventions ?   ? View : No data to display.  ?  ?  ?  ? ? ?

## 2022-02-18 ENCOUNTER — Encounter: Payer: Self-pay | Admitting: Surgery

## 2022-02-18 DIAGNOSIS — M1712 Unilateral primary osteoarthritis, left knee: Secondary | ICD-10-CM | POA: Diagnosis not present

## 2022-02-18 MED ORDER — LOSARTAN POTASSIUM 25 MG PO TABS
25.0000 mg | ORAL_TABLET | Freq: Every day | ORAL | Status: DC
  Administered 2022-02-18: 25 mg via ORAL
  Filled 2022-02-18: qty 1

## 2022-02-18 NOTE — Anesthesia Postprocedure Evaluation (Signed)
Anesthesia Post Note ? ?Patient: Judy Barber ? ?Procedure(s) Performed: UNICOMPARTMENTAL KNEE (Left: Knee) ? ?Patient location during evaluation: Nursing Unit ?Anesthesia Type: Spinal ?Level of consciousness: oriented and awake and alert ?Pain management: pain level controlled ?Vital Signs Assessment: post-procedure vital signs reviewed and stable ?Respiratory status: spontaneous breathing and respiratory function stable ?Cardiovascular status: blood pressure returned to baseline and stable ?Postop Assessment: no headache, no backache, no apparent nausea or vomiting and patient able to bend at knees ?Anesthetic complications: no ? ? ?No notable events documented. ? ? ?Last Vitals:  ?Vitals:  ? 02/18/22 0428 02/18/22 0754  ?BP: (!) 181/66 (!) 162/65  ?Pulse: 79 76  ?Resp: 20 18  ?Temp: 36.6 ?C 36.5 ?C  ?SpO2: 96% 97%  ?  ?Last Pain:  ?Vitals:  ? 02/18/22 0553  ?TempSrc:   ?PainSc: 6   ? ? ?  ?  ?  ?  ?  ?  ? ?[name] ? ? ? ? ?

## 2022-02-18 NOTE — Discharge Summary (Signed)
?Physician Discharge Summary  ?Patient ID: ?Judy Barber ?MRN: [id number] ?DOB/AGE: 06-10-1956 66 y.o. ? ?Admit date: 02/17/2022 ?Discharge date: 02/18/2022 ? ?Admission Diagnoses:  ?Status post left partial knee replacement [Z96.652] ?Osteoarthritis of the medial compartment of the left knee ? ?Discharge Diagnoses: ?Patient Active Problem List  ? Diagnosis Date Noted  ? Status post left partial knee replacement 02/17/2022  ? ? ?Past Medical History:  ?Diagnosis Date  ? Diabetes mellitus type 2, controlled (HCC)   ? History of kidney stones   ? Hyperlipidemia   ? Hypertension   ? Osteoarthritis of both knees   ? Pneumonia   ? Pulmonary sarcoidosis (HCC)   ? Sleep apnea   ? Vitamin D deficiency   ? ?  ?Transfusion: None. ?  ?Consultants (if any):  ? ?Discharged Condition: Improved ? ?Hospital Course: Judy Barber is an 66 y.o. female who was admitted 02/17/2022 with a diagnosis of osteoarthritis involving the medial compartment of the left knee and went to the operating room on 02/17/2022 and underwent the above named procedures.  ?  ?Surgeries: Procedure(s): ?UNICOMPARTMENTAL KNEE on 02/17/2022 ?Patient tolerated the surgery well. Taken to PACU where she was stabilized and then transferred to the orthopedic floor. ? ?Started on Eliquis 2.5mg  twice daily. Heels elevated on bed with rolled towels. No evidence of DVT. Negative Homan. ?Physical therapy started on day #1 for gait training and transfer. OT started day #1 for ADL and assisted devices. ? ?Patient's IV was removed on POD1. ? ?Implants: All-cemented Biomet Oxford system with an extra small femoral component, a "B" sized tibial tray, and a 3 mm meniscal bearing insert. ? ?She was given perioperative antibiotics:  ?Anti-infectives (From admission, onward)  ? ? Start     Dose/Rate Route Frequency Ordered Stop  ? 02/17/22 1330  ceFAZolin (ANCEF) IVPB 2g/100 mL premix       ? 2 g ?200 mL/hr over 30 Minutes Intravenous Every 6 hours 02/17/22 1154 02/18/22 0108  ? 02/17/22  0625  ceFAZolin (ANCEF) 2-4 GM/100ML-% IVPB       ?Note to Pharmacy: Letta Pate D: cabinet override  ?    02/17/22 0625 02/17/22 0759  ? 02/17/22 0600  ceFAZolin (ANCEF) IVPB 2g/100 mL premix       ? 2 g ?200 mL/hr over 30 Minutes Intravenous On call to O.R. 02/16/22 2240 02/17/22 0749  ? ?  ?. ? ?She was given sequential compression devices, early ambulation, and Eliquis for DVT prophylaxis. ? ?She benefited maximally from the hospital stay and there were no complications.   ? ?Recent vital signs:  ?Vitals:  ? 02/17/22 2026 02/18/22 0428  ?BP: (!) 156/60 (!) 181/66  ?Pulse: 89 79  ?Resp: 20 20  ?Temp: 97.9 ?F (36.6 ?C) 97.9 ?F (36.6 ?C)  ?SpO2: 99% 96%  ? ? ?Recent laboratory studies:  ?Lab Results  ?Component Value Date  ? HGB 12.7 02/04/2022  ? HGB 13.4 10/15/2020  ? HGB 11.6 (L) 08/08/2013  ? ?Lab Results  ?Component Value Date  ? WBC 5.4 02/04/2022  ? PLT 358 02/04/2022  ? ?No results found for: INR ?Lab Results  ?Component Value Date  ? NA 139 02/04/2022  ? K 4.1 02/04/2022  ? CL 104 02/04/2022  ? CO2 27 02/04/2022  ? BUN 14 02/04/2022  ? CREATININE 0.78 02/04/2022  ? GLUCOSE 150 (H) 02/04/2022  ? ? ?Discharge Medications:   ?Allergies as of 02/18/2022   ? ?   Reactions  ? Bee Venom Swelling  ?  Wasp Venom Swelling  ? Yellow [place]et Venom Swelling  ? ?  ? ?  ?Medication List  ?  ? ?STOP taking these medications   ? ?aspirin EC 81 MG tablet ?  ?ibuprofen 200 MG tablet ?Commonly known as: ADVIL ?  ?meloxicam 15 MG tablet ?Commonly known as: MOBIC ?  ? ?  ? ?TAKE these medications   ? ?apixaban 2.5 MG Tabs tablet ?Commonly known as: Eliquis ?Take 1 tablet (2.5 mg total) by mouth 2 (two) times daily. ?  ?azaTHIOprine 50 MG tablet ?Commonly known as: IMURAN ?Take 50 mg by mouth daily. ?  ?celecoxib 200 MG capsule ?Commonly known as: CELEBREX ?Take 200 mg by mouth 2 (two) times daily. ?  ?cholecalciferol 25 MCG (1000 UNIT) tablet ?Commonly known as: VITAMIN D3 ?Take 1,000 Units by mouth daily. ?  ?glimepiride 4 MG  tablet ?Commonly known as: AMARYL ?Take 4 mg by mouth in the morning and at bedtime. ?  ?losartan 25 MG tablet ?Commonly known as: COZAAR ?Take 25 mg by mouth daily. ?  ?metFORMIN 500 MG 24 hr tablet ?Commonly known as: GLUCOPHAGE-XR ?Take 500 mg by mouth 2 (two) times daily. ?  ?oxyCODONE 5 MG immediate release tablet ?Commonly known as: Roxicodone ?Take 1-2 tablets (5-10 mg total) by mouth every 4 (four) hours as needed for moderate pain or severe pain. ?  ?simvastatin 20 MG tablet ?Commonly known as: ZOCOR ?Take 20 mg by mouth daily. ?  ? ?  ? ?  ?  ? ? ?  ?Durable Medical Equipment  ?(From admission, onward)  ?  ? ? ?  ? ?  Start     Ordered  ? 02/17/22 1033  For home use only DME Walker rolling  Once       ?Question Answer Comment  ?Walker: With 5 Inch Wheels   ?Patient needs a walker to treat with the following condition Difficulty walking   ?  ? 02/17/22 1033  ? ?  ?  ? ?  ? ? ?Diagnostic Studies: DG Knee Left Port ? ?Result Date: 02/17/2022 ?CLINICAL DATA:  Postop left knee. EXAM: PORTABLE LEFT KNEE - 1-2 VIEW COMPARISON:  None Available. FINDINGS: Medial compartment hemiarthroplasty in expected alignment. No periprosthetic lucency or fracture. Recent postsurgical change includes air and edema in the joint space and soft tissues. Anterior skin staples. IMPRESSION: Medial compartment hemiarthroplasty without immediate postoperative complication. Electronically Signed   By: Narda Rutherford M.D.   On: 02/17/2022 12:04   ? ?Disposition: Plan for discharge home today pending progress with therapy. ? ? Follow-up Information   ? ? Anson [place], PA-C Follow up in 14 day(s).   ?Specialty: Physician Assistant ?Why: Staple Removal. ?Contact information: ?[address] ?[place] [address] ?[phone] ? ? ?  ?  ? ?  ?  ? ?  ? ?Signed: ?Meriel Pica PA-C ?02/18/2022, 7:49 AM  ?

## 2022-02-18 NOTE — Progress Notes (Signed)
Discharge Note: ?Reviewed d/ced instructions. Pt verbalized  understanding. PT d'ced with 3 in 1, RW,  polar care, extra honeycomb dressing, compression stockings, and all personal belongings. Iv cath intact when removed. Staff wheeled pt out. Pt transported to home via family vehicle. ?

## 2022-02-18 NOTE — Progress Notes (Signed)
?  Subjective: ?1 Day Post-Op Procedure(s) (LRB): ?UNICOMPARTMENTAL KNEE (Left) ?Patient reports pain as 5 on 0-10 scale.   ?Patient is well, and has had no acute complaints or problems ?Plan is to go Home after hospital stay. ?Patient is scheduled for outpatient therapy on Monday of next week. ?Negative for chest pain and shortness of breath ?Fever: no ?Gastrointestinal:Negative for nausea and vomiting ? ?Objective: ?Vital signs in last 24 hours: ?Temp:  [97.9 ?F (36.6 ?C)-98.3 ?F (36.8 ?C)] 97.9 ?F (36.6 ?C) (05/12 0428) ?Pulse Rate:  [79-100] 79 (05/12 0428) ?Resp:  [11-20] 20 (05/12 0428) ?BP: (129-181)/(49-76) 181/66 (05/12 0428) ?SpO2:  [95 %-100 %] 96 % (05/12 0428) ?Weight:  [90.7 kg] 90.7 kg (05/11 1619) ? ?Intake/Output from previous day: ? ?Intake/Output Summary (Last 24 hours) at 02/18/2022 0742 ?Last data filed at 02/17/2022 1856 ?Gross per 24 hour  ?Intake 2210 ml  ?Output 5 ml  ?Net 2205 ml  ?  ?Intake/Output this shift: ?No intake/output data recorded. ? ?Labs: ?No results for input(s): HGB in the last 72 hours. ?No results for input(s): WBC, RBC, HCT, PLT in the last 72 hours. ?No results for input(s): NA, K, CL, CO2, BUN, CREATININE, GLUCOSE, CALCIUM in the last 72 hours. ?No results for input(s): LABPT, INR in the last 72 hours. ? ? ?EXAM ?General - Patient is Alert, Appropriate, and Oriented ?Extremity - ABD soft ?Neurovascular intact ?Dorsiflexion/Plantar flexion intact ?Incision: scant drainage ?No cellulitis present ?Dressing/Incision - Bloody drainage noted to the proximal aspect of the incision.  Honeycomb intact. ?Motor Function - intact, moving foot and toes well on exam.  ?Abdomen soft with intact bowel sounds. ?Negative Homan's to bilateral lower extremities. ? ?Past Medical History:  ?Diagnosis Date  ? Diabetes mellitus type 2, controlled (HCC)   ? History of kidney stones   ? Hyperlipidemia   ? Hypertension   ? Osteoarthritis of both knees   ? Pneumonia   ? Pulmonary sarcoidosis (HCC)   ?  Sleep apnea   ? Vitamin D deficiency   ? ? ?Assessment/Plan: ?1 Day Post-Op Procedure(s) (LRB): ?UNICOMPARTMENTAL KNEE (Left) ?Principal Problem: ?  Status post left partial knee replacement ? ?Estimated body mass index is 35.42 kg/m? as calculated from the following: ?  Height as of this encounter: 5\' 3"  (1.6 m). ?  Weight as of this encounter: 90.7 kg. ?Advance diet ?Up with therapy ?D/C IV fluids when tolerating po intake. ? ?Vitals stable this morning.  BP 181/66, restarted on her home dose of losartan. ?Up with therapy today. Will place order for rolling walker today. ?Plan for discharge home this afternoon pending progress with therapy. ? ?DVT Prophylaxis -  Eliquis ?Weight-Bearing as tolerated to left leg ? ? , PA-C ?[facility] ?02/18/2022, 7:42 AM  ?

## 2022-02-18 NOTE — TOC Transition Note (Addendum)
Transition of Care (TOC) - CM/SW Discharge Note ? ? ?Patient Details  ?Name: Judy Barber ?MRN: [id number] ?Date of Birth: 1956-04-13 ? ?Transition of Care (TOC) CM/SW Contact:  ?Sharaine Delange E Alroy Portela, LCSW ?Phone Number: ?02/18/2022, 9:40 AM ? ? ?Clinical Narrative:   Spoke to patient regarding option for OPPT since [facility] could not be arranged. Patient says she is already set up with OPPT, has an appointment at the [facility] in [facility] for 5/15.  ? ?11:20- Notified by LPN that patient needs a RW. Referral made to [facility] with [employer].  ? ?12:05- Called Shelia with [employer] to check status of RW. She is checking.  ? ?12:20- Per Silvio Pate with [employer], DME was delivered yesterday. Informed her this was a 3in1 but patient also need RW per LPN.  ? ?1:22- Requested update on RW delivery per LPN request.  ? ?1:33- Per LPN RW was delivered.  ? ? ?Final next level of care: OP Rehab ?Barriers to Discharge: Barriers Resolved ? ? ?Patient Goals and CMS Choice ?Patient states their goals for this hospitalization and ongoing recovery are:: will go to OP rehab on the 15th ?CMS Medicare.gov Compare Post Acute Care list provided to:: Patient ?Choice offered to / list presented to : Patient ? ?Discharge Placement ?  ?           ?  ?  ?  ?Patient and family notified of of transfer: 02/18/22 ? ?Discharge Plan and Services ?  ?  ?           ?DME Arranged: 3-N-1 ?DME Agency: [employer] ?  ?  ?  ?  ?  ?  ?  ?  ? ?Social Determinants of Health (SDOH) Interventions ?  ? ? ?Readmission Risk Interventions ?   ? View : No data to display.  ?  ?  ?  ? ? ? ? ? ?

## 2022-03-08 ENCOUNTER — Ambulatory Visit
Admission: RE | Admit: 2022-03-08 | Discharge: 2022-03-08 | Disposition: A | Discharge status: Home / Self Care | Payer: 59 | Source: Ambulatory Visit | Attending: Internal Medicine | Admitting: Internal Medicine

## 2022-03-08 DIAGNOSIS — Z1231 Encounter for screening mammogram for malignant neoplasm of breast: Secondary | ICD-10-CM | POA: Diagnosis present

## 2022-03-11 ENCOUNTER — Emergency Department
Admission: EM | Admit: 2022-03-11 | Discharge: 2022-03-11 | Disposition: A | Discharge status: Home / Self Care | Payer: 59 | Attending: Emergency Medicine | Admitting: Emergency Medicine

## 2022-03-11 ENCOUNTER — Encounter: Payer: Self-pay | Admitting: Emergency Medicine

## 2022-03-11 ENCOUNTER — Other Ambulatory Visit: Payer: Self-pay

## 2022-03-11 ENCOUNTER — Emergency Department: Payer: 59

## 2022-03-11 DIAGNOSIS — I10 Essential (primary) hypertension: Secondary | ICD-10-CM | POA: Insufficient documentation

## 2022-03-11 DIAGNOSIS — G44209 Tension-type headache, unspecified, not intractable: Secondary | ICD-10-CM | POA: Diagnosis not present

## 2022-03-11 DIAGNOSIS — E119 Type 2 diabetes mellitus without complications: Secondary | ICD-10-CM | POA: Insufficient documentation

## 2022-03-11 DIAGNOSIS — R519 Headache, unspecified: Secondary | ICD-10-CM | POA: Diagnosis present

## 2022-03-11 MED ORDER — SODIUM CHLORIDE 0.9 % IV BOLUS
500.0000 mL | Freq: Once | INTRAVENOUS | Status: AC
  Administered 2022-03-11: 500 mL via INTRAVENOUS

## 2022-03-11 MED ORDER — BUTALBITAL-APAP-CAFFEINE 50-325-40 MG PO TABS: 1.0000 | ORAL_TABLET | Freq: Four times a day (QID) | ORAL | 0 refills | Status: DC | PRN

## 2022-03-11 MED ORDER — METOCLOPRAMIDE HCL 5 MG/ML IJ SOLN
5.0000 mg | Freq: Once | INTRAMUSCULAR | Status: AC
Start: 2022-03-11 — End: 2022-03-11
  Administered 2022-03-11: 5 mg via INTRAVENOUS
  Filled 2022-03-11: qty 2

## 2022-03-11 MED ORDER — MORPHINE SULFATE (PF) 2 MG/ML IV SOLN
2.0000 mg | Freq: Once | INTRAVENOUS | Status: AC
  Administered 2022-03-11: 2 mg via INTRAVENOUS
  Filled 2022-03-11: qty 1

## 2022-03-11 MED ORDER — DIPHENHYDRAMINE HCL 50 MG/ML IJ SOLN
25.0000 mg | Freq: Once | INTRAMUSCULAR | Status: AC
  Administered 2022-03-11: 25 mg via INTRAVENOUS
  Filled 2022-03-11: qty 1

## 2022-03-11 MED ORDER — KETOROLAC TROMETHAMINE 15 MG/ML IJ SOLN
15.0000 mg | Freq: Once | INTRAMUSCULAR | Status: AC
  Administered 2022-03-11: 15 mg via INTRAVENOUS
  Filled 2022-03-11: qty 1

## 2022-03-11 NOTE — ED Triage Notes (Signed)
Pt to ED via POV c/o headache that woke her up this morning. Pt states that she is also having pain in her neck. Pt states that the pain is all over her head. Pt denies nausea, vomiting, or light sensitivity. Pt denies hx/o migraines. Pain is increased when pt bends neck forward. Pt denies recent illness. Pt states that she took Tylenol 1,00 mg this morning around 0500 but it has not helped with the pain.

## 2022-03-11 NOTE — ED Provider Notes (Signed)
[facility] Provider Note    Event Date/Time   First MD Initiated Contact with Patient 03/11/22 0815     (approximate)   History   Headache and Neck Pain   HPI  Judy Barber is a 66 y.o. female with a history of hypertension, diabetes who presents with complaints of headache.  Patient reports yesterday she had a "crick in her neck "and today she has pain traveling from her neck to both sides of her head, she reports it is painful to move.  She denies fevers or chills.  No vomiting.  No neurodeficits.     Physical Exam   Triage Vital Signs: ED Triage Vitals  Enc Vitals Group     BP 03/11/22 0811 (!) 172/73     Pulse Rate 03/11/22 0811 86     Resp 03/11/22 0811 16     Temp 03/11/22 0811 98.2 F (36.8 C)     Temp Source 03/11/22 0811 Oral     SpO2 03/11/22 0811 97 %     Weight 03/11/22 0812 90.7 kg (200 lb)     Height 03/11/22 0812 1.6 m (5\' 3" )     Head Circumference --      Peak Flow --      Pain Score 03/11/22 0812 10     Pain Loc --      Pain Edu? --      Excl. in GC? --     Most recent vital signs: Vitals:   03/11/22 0811  BP: (!) 172/73  Pulse: 86  Resp: 16  Temp: 98.2 F (36.8 C)  SpO2: 97%     General: Awake, no distress.  CV:  Good peripheral perfusion.  Resp:  Normal effort.  Abd:  No distention.  Other:  Tenderness palpation along the paraspinal muscles cervical spine, no bony tenderness   ED Results / Procedures / Treatments   Labs (all labs ordered are listed, but only abnormal results are displayed) Labs Reviewed - No data to display   EKG     RADIOLOGY CT head viewed interpreted by me, no acute abnormality    PROCEDURES:  Critical Care performed:   Procedures   MEDICATIONS ORDERED IN ED: Medications  ketorolac (TORADOL) 15 MG/ML injection 15 mg (15 mg Intravenous Given 03/11/22 0903)  diphenhydrAMINE (BENADRYL) injection 25 mg (25 mg Intravenous Given 03/11/22 0903)  metoCLOPramide (REGLAN)  injection 5 mg (5 mg Intravenous Given 03/11/22 1030)  sodium chloride 0.9 % bolus 500 mL (0 mLs Intravenous Stopped 03/11/22 1049)  morphine (PF) 2 MG/ML injection 2 mg (2 mg Intravenous Given 03/11/22 1104)     IMPRESSION / MDM / ASSESSMENT AND PLAN / ED COURSE  I reviewed the triage vital signs and the nursing notes. Patient's presentation is most consistent with acute presentation with potential threat to life or bodily function.  Patient presents with acute headache as detailed above.  Differential includes tension headache/musculoskeletal pain, given her age and hypertension intracranial hemorrhage is a possibility  We will obtain CT head to rule out hemorrhage  If negative will treat with IV Toradol, IV Reglan, IV Benadryl, IV fluids and reevaluate.  Patient had improvement with treatment, added IV morphine which further improved her headache.  She is feeling improved, is requesting discharge, will prescribe analgesic for home feel her headache is consistent with tension headache.  CT head is unremarkable, return precautions discussed        FINAL CLINICAL IMPRESSION(S) / ED DIAGNOSES  Final diagnoses:  Tension headache     Rx / DC Orders   ED Discharge Orders          Ordered    butalbital-acetaminophen-caffeine (FIORICET) 50-325-40 MG tablet  Every 6 hours PRN        03/11/22 1112             Note:  This document was prepared using Dragon voice recognition software and may include unintentional dictation errors.   Jene Every, MD 03/11/22 1116

## 2022-03-26 ENCOUNTER — Encounter: Payer: Self-pay | Admitting: Emergency Medicine

## 2022-03-26 ENCOUNTER — Ambulatory Visit
Admission: EM | Admit: 2022-03-26 | Discharge: 2022-03-26 | Disposition: A | Discharge status: Home / Self Care | Payer: 59

## 2022-03-26 DIAGNOSIS — H00024 Hordeolum internum left upper eyelid: Secondary | ICD-10-CM | POA: Diagnosis not present

## 2022-03-26 MED ORDER — ERYTHROMYCIN 5 MG/GM OP OINT: TOPICAL_OINTMENT | OPHTHALMIC | 0 refills | Status: DC

## 2022-03-26 NOTE — ED Triage Notes (Signed)
Patient reports some soreness and watery drainage from the corner of her left eye that started last night.

## 2022-03-26 NOTE — ED Provider Notes (Signed)
[facility]    CSN: [id number] Arrival date & time: 03/26/22  1158      History   Chief Complaint Chief Complaint  Patient presents with  . Eye Problem    left    HPI Judy Barber is a 65 y.o. female presenting with L upper lid soreness and drainage x1 day. History R lazy eye, unchanged. Denies trauma to the eye or foreign body sensation. States the L upper inner lid feels like there may be swelling. Watering and scant crusting in the morning.  Denies trauma to the eye, foreign body sensation, eye pain, eye pain with movement, vision changes, new flashes of light or floaters in field of vision; denies right eye symptoms.  HPI  Past Medical History:  Diagnosis Date  . Diabetes mellitus type 2, controlled (HCC)   . History of kidney stones   . Hyperlipidemia   . Hypertension   . Osteoarthritis of both knees   . Pneumonia   . Pulmonary sarcoidosis (HCC)   . Sleep apnea   . Vitamin D deficiency     Patient Active Problem List   Diagnosis Date Noted  . 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 ABLATION     cardiac 2000's  . APPENDECTOMY  1980  . BREAST BIOPSY Left 2017   benign  . BREAST EXCISIONAL BIOPSY Right 2013   benign  . COLONOSCOPY  2019  . KNEE ARTHROSCOPY W/ MENISCECTOMY Right 03/01/2017  . LUNG BIOPSY  2005   sarcoidosis  . PARTIAL KNEE ARTHROPLASTY Left 02/17/2022   Procedure: UNICOMPARTMENTAL KNEE;  Surgeon: Christena Flake, MD;  Location: [facility] ORS;  Service: Orthopedics;  Laterality: Left;  . STRABISMUS SURGERY Bilateral    as a child  . TUBAL LIGATION      OB History   No obstetric history on file.      Home Medications    Prior to Admission medications   Medication Sig Start Date End Date Taking? Authorizing Provider  aspirin 325 MG tablet Take 325 mg by mouth daily.   Yes [provider]  cholecalciferol (VITAMIN D3) 25 MCG (1000 UNIT) tablet Take  1,000 Units by mouth daily.   Yes [provider]  erythromycin ophthalmic ointment Place a 1/2 inch ribbon of ointment into the lower eyelid at bedtime x7 days 03/26/22  Yes Rhys Martini, PA-C  glimepiride (AMARYL) 4 MG tablet Take 4 mg by mouth in the morning and at bedtime. 01/26/21  Yes [provider]  losartan (COZAAR) 25 MG tablet Take 25 mg by mouth daily.   Yes [provider]  metFORMIN (GLUCOPHAGE-XR) 500 MG 24 hr tablet Take 500 mg by mouth 2 (two) times daily. 09/27/21  Yes [provider]  oxyCODONE (ROXICODONE) 5 MG immediate release tablet Take 1-2 tablets (5-10 mg total) by mouth every 4 (four) hours as needed for moderate pain or severe pain. 02/17/22  Yes Poggi, Excell Seltzer, MD  simvastatin (ZOCOR) 20 MG tablet Take 20 mg by mouth daily.   Yes [provider]  azaTHIOprine (IMURAN) 50 MG tablet Take 50 mg by mouth daily. 01/09/22   [provider]  butalbital-acetaminophen-caffeine (FIORICET) 50-325-40 MG tablet Take 1-2 tablets by mouth every 6 (six) hours as needed for headache. 03/11/22 03/11/23  Jene Every, MD    Family History Family History  Problem Relation Age of Onset  . Heart attack Mother   .  Stroke Father   . Breast cancer Cousin     Social History Social History   Tobacco Use  . Smoking status: Former    Types: Cigarettes    Quit date: 2005    Years since quitting: 18.4  . Smokeless tobacco: Never  Vaping Use  . Vaping Use: Never used  Substance Use Topics  . Alcohol use: Not Currently  . Drug use: Never     Allergies   Bee venom, Wasp venom, and Yellow jacket venom   Review of Systems Review of Systems  Eyes:  Negative for photophobia, pain, redness, itching and visual disturbance.  All other systems reviewed and are negative.    Physical Exam Triage Vital Signs ED Triage Vitals  Enc Vitals Group     BP 03/26/22 1211 (!) 175/73     Pulse Rate 03/26/22 1211 76     Resp 03/26/22 1211 14      Temp 03/26/22 1211 98.2 F (36.8 C)     Temp Source 03/26/22 1211 Oral     SpO2 03/26/22 1211 100 %     Weight 03/26/22 1208 200 lb (90.7 kg)     Height 03/26/22 1208 5\' 3"  (1.6 m)     Head Circumference --      Peak Flow --      Pain Score 03/26/22 1208 2     Pain Loc --      Pain Edu? --      Excl. in GC? --    No data found.  Updated Vital Signs BP (!) 175/73 (BP Location: Left Arm)   Pulse 76   Temp 98.2 F (36.8 C) (Oral)   Resp 14   Ht 5\' 3"  (1.6 m)   Wt 200 lb (90.7 kg)   SpO2 100%   BMI 35.43 kg/m   Visual Acuity Right Eye Distance: lazy eye Left Eye Distance: 20/30 corrected Bilateral Distance: 20/30 corrected  Right Eye Near:   Left Eye Near:    Bilateral Near:     Physical Exam Vitals reviewed.  Constitutional:      Appearance: Normal appearance.  HENT:     Head: Normocephalic and atraumatic.     Right Ear: Tympanic membrane, ear canal and external ear normal. There is no impacted cerumen.     Left Ear: Tympanic membrane, ear canal and external ear normal. There is no impacted cerumen.     Nose: Nose normal. No congestion.     Mouth/Throat:     Pharynx: Oropharynx is clear. No posterior oropharyngeal erythema.  Eyes:     General: Lids are normal. Lids are everted, no foreign bodies appreciated. Vision grossly intact. Gaze aligned appropriately. No visual field deficit.       Right eye: No foreign body, discharge or hordeolum.        Left eye: Hordeolum present.No foreign body or discharge.     Extraocular Movements: Extraocular movements intact.     Right eye: Normal extraocular motion and no nystagmus.     Left eye: Normal extraocular motion and no nystagmus.     Conjunctiva/sclera: Conjunctivae normal.     Right eye: Right conjunctiva is not injected. No chemosis, exudate or hemorrhage.    Left eye: Left conjunctiva is not injected. No chemosis, exudate or hemorrhage.    Pupils: Pupils are equal, round, and reactive to light.     Visual  Fields: Right eye visual fields normal and left eye visual fields normal.     Comments: L  upper inner lid with tiny hordeolum internum along lashline. No conjunctival injection or external lid changes. PERRLA, EOMI without pain. No orbital tenderness. Visual acuity intact wearing glasses. R strabismus, no conjunctival changes.   Cardiovascular:     Rate and Rhythm: Normal rate and regular rhythm.     Heart sounds: Normal heart sounds.  Pulmonary:     Effort: Pulmonary effort is normal.     Breath sounds: Normal breath sounds.  Neurological:     General: No focal deficit present.     Mental Status: She is alert.  Psychiatric:        Mood and Affect: Mood normal.        Behavior: Behavior normal.        Thought Content: Thought content normal.        Judgment: Judgment normal.     UC Treatments / Results  Labs (all labs ordered are listed, but only abnormal results are displayed) Labs Reviewed - No data to display  EKG   Radiology No results found.  Procedures Procedures (including critical care time)  Medications Ordered in UC Medications - No data to display  Initial Impression / Assessment and Plan / UC Course  I have reviewed the triage vital signs and the nursing notes.  Pertinent labs & imaging results that were available during my care of the patient were reviewed by me and considered in my medical decision making (see chart for details).     This patient is a very pleasant 66 y.o. year old female presenting with L upper inner hordeolum. No conjunctival changes. Visual acuity intact wearing glasses. R strabismus is unchanged. Erythromycin sent. ED return precautions discussed. Patient verbalizes understanding and agreement.   Final Clinical Impressions(s) / UC Diagnoses   Final diagnoses:  Hordeolum internum left upper eyelid     Discharge Instructions      -You have a stye (hordeolum). This is an inflamed oil gland noted on the margin of the eyelid at the  level of the eyelashes.  -We are treating it with an antibiotic ointment called erythromycin.  Use this once nightly for about 7 days.  Pull down the lower eyelid, and place about half an inch inside.  This will be messy, so press the remaining ointment around the eye.  You can wash your face with gentle soap and water in the morning to wash off any remaining ointment. -Warm compresses -Seek additional medical attention if symptoms get worse, like eye pain, eye lid swelling, vision changes. Follow-up with your eye doctor if possible, but we're also happy to see you!    ED Prescriptions     Medication Sig Dispense Auth. Provider   erythromycin ophthalmic ointment Place a 1/2 inch ribbon of ointment into the lower eyelid at bedtime x7 days 3.5 g Rhys Martini, PA-C      PDMP not reviewed this encounter.   Rhys Martini, PA-C 03/26/22 1240

## 2022-03-26 NOTE — Discharge Instructions (Addendum)
-  You have a stye (hordeolum). This is an inflamed oil gland noted on the margin of the eyelid at the level of the eyelashes.  -We are treating it with an antibiotic ointment called erythromycin.  Use this once nightly for about 7 days.  Pull down the lower eyelid, and place about half an inch inside.  This will be messy, so press the remaining ointment around the eye.  You can wash your face with gentle soap and water in the morning to wash off any remaining ointment. -Warm compresses -Seek additional medical attention if symptoms get worse, like eye pain, eye lid swelling, vision changes. Follow-up with your eye doctor if possible, but we're also happy to see you! 

## 2022-06-25 ENCOUNTER — Encounter: Payer: Self-pay | Admitting: Emergency Medicine

## 2022-06-25 ENCOUNTER — Ambulatory Visit
Admission: EM | Admit: 2022-06-25 | Discharge: 2022-06-25 | Disposition: A | Discharge status: Home / Self Care | Payer: 59

## 2022-06-25 DIAGNOSIS — M62838 Other muscle spasm: Secondary | ICD-10-CM

## 2022-06-25 MED ORDER — KETOROLAC TROMETHAMINE 60 MG/2ML IM SOLN
30.0000 mg | Freq: Once | INTRAMUSCULAR | Status: AC
  Administered 2022-06-25: 30 mg via INTRAMUSCULAR

## 2022-06-25 MED ORDER — NAPROXEN 500 MG PO TABS: 500.0000 mg | ORAL_TABLET | Freq: Two times a day (BID) | ORAL | 0 refills | Status: DC

## 2022-06-25 NOTE — ED Provider Notes (Signed)
[facility] [place]   Name: Judy Barber DOB: 1956-02-15 MRN: [id number] CSN: [id number] PCP: Margaretann Loveless, MD  Arrival date and time:  06/25/22 0913  Chief Complaint:  Back Pain   NOTE: Prior to seeing the patient today, I have reviewed the triage nursing documentation and vital signs. Clinical staff has updated patient's PMH/PSHx, current medication list, and drug allergies/intolerances to ensure comprehensive history available to assist in medical decision making.   History:   HPI: Judy Barber is a 66 y.o. female who presents today with complaints of acute mid back pain.  Patient states she woke up this morning was unable to get out of bed without using her arms.  She felt immediate shooting pain to her mid back upon rising, but was able to ambulate to the restroom. No previous history 2 injuries in her back or shoulder.  No changes to bowel or bladder function.  No numbness or tingling to her extremities.   Past Medical History:  Diagnosis Date   Diabetes mellitus type 2, controlled (HCC)    History of kidney stones    Hyperlipidemia    Hypertension    Osteoarthritis of both knees    Pneumonia    Pulmonary sarcoidosis (HCC)    Sleep apnea    Vitamin D deficiency     Past Surgical History:  Procedure Laterality Date   ABLATION     cardiac 2000's   APPENDECTOMY  1980   BREAST BIOPSY Left 2017   benign   BREAST EXCISIONAL BIOPSY Right 2013   benign   COLONOSCOPY  2019   KNEE ARTHROSCOPY W/ MENISCECTOMY Right 03/01/2017   LUNG BIOPSY  2005   sarcoidosis   PARTIAL KNEE ARTHROPLASTY Left 02/17/2022   Procedure: UNICOMPARTMENTAL KNEE;  Surgeon: Christena Flake, MD;  Location: [facility] ORS;  Service: Orthopedics;  Laterality: Left;   STRABISMUS SURGERY Bilateral    as a child   TUBAL LIGATION      Family History  Problem Relation Age of Onset   Heart attack Mother    Stroke Father    Breast cancer Cousin     Social History   Tobacco Use    Smoking status: Former    Types: Cigarettes    Quit date: 2005    Years since quitting: 18.7   Smokeless tobacco: Never  Vaping Use   Vaping Use: Never used  Substance Use Topics   Alcohol use: Not Currently   Drug use: Never    Patient Active Problem List   Diagnosis Date Noted   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 Take with a full glass of water on an empty stomach.   dapagliflozin propanediol (FARXIGA) 10 MG TABS tablet Take 10 mg by mouth daily.   naproxen (NAPROSYN) 500 MG tablet Take 1 tablet (500 mg total) by mouth 2 (two) times daily.    Allergies:   Bee venom, Wasp venom, and Yellow jacket venom  Review of Systems (ROS): Review of Systems  Constitutional: Negative.   Genitourinary: Negative.   Musculoskeletal:  Positive for back pain. Negative for arthralgias, myalgias and neck pain.  Neurological: Negative.      Vital Signs: Today's Vitals   06/25/22 0924 06/25/22 0925 06/25/22 0927  BP:   (!) 166/79  Pulse:   89  Resp:   15  Temp:   98.1 F (36.7 C)  TempSrc:   Oral  SpO2:   96%  Weight:  200 lb (90.7 kg)   Height:  5\' 3"  (1.6 m)   PainSc: 9       Physical Exam: Physical Exam Vitals and nursing note reviewed.  Constitutional:      Appearance: Normal appearance.  Cardiovascular:     Rate and Rhythm: Normal rate and regular rhythm.     Pulses: Normal pulses.     Heart sounds: Normal heart sounds.  Pulmonary:     Effort: Pulmonary effort is normal.     Breath sounds: Normal breath sounds.  Musculoskeletal:     Cervical back: Normal.     Thoracic back: Tenderness present. No edema, signs of trauma or bony tenderness.     Lumbar back: Normal.     Comments: Full range of motion.  Patient able to complete exam without limitation or pain.  Skin:    General: Skin is warm and dry.  Neurological:      General: No focal deficit present.     Mental Status: She is alert and oriented to person, place, and time.  Psychiatric:        Mood and Affect: Mood normal.        Behavior: Behavior normal.      Urgent Care Treatments / Results:   LABS: PLEASE NOTE: all labs that were ordered this encounter are listed, however only abnormal results are displayed. Labs Reviewed - No data to display  EKG: -None  RADIOLOGY: No results found.  PROCEDURES: Procedures  MEDICATIONS RECEIVED THIS VISIT: Medications  ketorolac (TORADOL) injection 30 mg (has no administration in time range)    PERTINENT CLINICAL COURSE NOTES/UPDATES:   Initial Impression / Assessment and Plan / Urgent Care Course:  Pertinent labs & imaging results that were available during my care of the patient were personally reviewed by me and considered in my medical decision making (see lab/imaging section of note for values and interpretations).  [name] is a 66 y.o. female who presents to [facility] today with complaints of back pain, diagnosed with thoracic pain/muscle strain, and treated as such with the medications below. NP and patient reviewed discharge instructions below during visit.   Patient is well appearing overall in clinic today. She does not appear to be in any acute distress. Presenting symptoms (see HPI) and exam as documented above.   I have reviewed the follow up and strict return precautions for any new or worsening symptoms. Patient is aware of symptoms that would be deemed urgent/emergent, and would thus require further evaluation either here or in the emergency department. At the time of discharge, she verbalized understanding and consent with the discharge plan as it was reviewed with her. All questions were fielded by provider and/or clinic staff prior to patient discharge.    Final Clinical Impressions / Urgent Care Diagnoses:   Final diagnoses:  Muscle spasm    New Prescriptions:   [place] Controlled Substance Registry consulted? Not Applicable  Meds ordered this encounter  Medications   ketorolac (TORADOL) injection 30 mg   naproxen (NAPROSYN) 500 MG tablet    Sig: Take 1 tablet (500 mg total) by mouth 2 (two) times daily.    Dispense:  14 tablet    Refill:  0      Discharge Instructions      You were seen for back pain and are being treated  for muscle spasm.   -Received a shot today called Toradol.  This is in the same category as ibuprofen or naproxen.  Do not take any NSAIDs until 8 PM tonight if needed. -You are being prescribed naproxen for your muscle strain.  Do not take it more than 2 times a day as needed.  Make sure you have food in your stomach 40 take medication. -Use a warm compress/heating pad to the affected area as needed. -Follow-up with your primary care provider if the pain does not improve; follow-up at your nearest urgent care facility if the pain worsens before seeing her primary.   Take care, Dr. Sharlet Salina, NP-c     Recommended Follow up Care:  Patient encouraged to follow up with the following provider within the specified time frame, or sooner as dictated by the severity of her symptoms. As always, she was instructed that for any urgent/emergent care needs, she should seek care either here or in the emergency department for more immediate evaluation.   Bailey Mech, DNP, NP-c   Bailey Mech, NP 06/25/22 1037

## 2022-06-25 NOTE — ED Triage Notes (Signed)
Patient c/o lower to mid back pain that started this morning while trying to get out of bed.  Patient denies any falls.

## 2022-06-25 NOTE — Discharge Instructions (Addendum)
You were seen for back pain and are being treated for muscle spasm.   -Received a shot today called Toradol.  This is in the same category as ibuprofen or naproxen.  Do not take any NSAIDs until 8 PM tonight if needed. -You are being prescribed naproxen for your muscle strain.  Do not take it more than 2 times a day as needed.  Make sure you have food in your stomach 40 take medication. -Use a warm compress/heating pad to the affected area as needed. -Follow-up with your primary care provider if the pain does not improve; follow-up at your nearest urgent care facility if the pain worsens before seeing her primary.   Take care, Dr. Marland Kitchen, NP-c

## 2022-09-06 ENCOUNTER — Other Ambulatory Visit: Payer: Self-pay | Admitting: Surgery

## 2022-09-06 DIAGNOSIS — M1711 Unilateral primary osteoarthritis, right knee: Secondary | ICD-10-CM

## 2022-09-06 DIAGNOSIS — Z96652 Presence of left artificial knee joint: Secondary | ICD-10-CM

## 2022-09-20 ENCOUNTER — Ambulatory Visit
Admission: RE | Admit: 2022-09-20 | Discharge: 2022-09-20 | Disposition: A | Discharge status: Home / Self Care | Payer: 59 | Source: Ambulatory Visit | Attending: Surgery | Admitting: Surgery

## 2022-09-20 DIAGNOSIS — Z96652 Presence of left artificial knee joint: Secondary | ICD-10-CM | POA: Insufficient documentation

## 2022-09-20 DIAGNOSIS — M1711 Unilateral primary osteoarthritis, right knee: Secondary | ICD-10-CM | POA: Insufficient documentation

## 2022-11-22 ENCOUNTER — Other Ambulatory Visit: Payer: Self-pay | Admitting: Surgery

## 2022-11-24 ENCOUNTER — Inpatient Hospital Stay: Admission: RE | Admit: 2022-11-24 | Payer: 59 | Source: Ambulatory Visit

## 2022-11-24 ENCOUNTER — Encounter
Admission: RE | Admit: 2022-11-24 | Discharge: 2022-11-24 | Disposition: A | Discharge status: Home / Self Care | Payer: 59 | Source: Ambulatory Visit | Attending: Surgery | Admitting: Surgery

## 2022-11-24 VITALS — Ht 63.0 in | Wt 205.9 lb

## 2022-11-24 DIAGNOSIS — E119 Type 2 diabetes mellitus without complications: Secondary | ICD-10-CM

## 2022-11-24 DIAGNOSIS — Z01818 Encounter for other preprocedural examination: Secondary | ICD-10-CM

## 2022-11-24 DIAGNOSIS — Z01812 Encounter for preprocedural laboratory examination: Secondary | ICD-10-CM

## 2022-11-24 HISTORY — DX: Cardiac murmur, unspecified: R01.1

## 2022-11-24 HISTORY — DX: Unspecified atrial fibrillation: I48.91

## 2022-11-24 NOTE — Patient Instructions (Addendum)
Your procedure is scheduled on:12-06-22 Tuesday Report to the Registration Desk on the 1st floor of the [facility].Then proceed to the 2nd floor Surgery Desk To find out your arrival time, please call [phone] between 1PM - 3PM on:12-05-22 Monday If your arrival time is 6:00 am, do not arrive before that time as the [facility] entrance doors do not open until 6:00 am.  REMEMBER: Instructions that are not followed completely may result in serious medical risk, up to and including death; or upon the discretion of your surgeon and anesthesiologist your surgery may need to be rescheduled.  Do not eat food after midnight the night before surgery.  No gum chewing or hard candies.  You may however, drink Water up to 2 hours before you are scheduled to arrive for your surgery. Do not drink anything within 2 hours of your scheduled arrival time.  In addition, your doctor has ordered for you to drink the provided: Gatorade G2 Drinking this carbohydrate drink up to two hours before surgery helps to reduce insulin resistance and improve patient outcomes. Please complete drinking 2 hours before scheduled arrival time.  One week prior to surgery:Last dose will be on 11-28-22 Stop Anti-inflammatories (NSAIDS) such as meloxicam (MOBIC) ,Advil, Aleve, Ibuprofen, Motrin, Naproxen, Naprosyn and Aspirin based products such as Excedrin, Goody's Powder, BC Powder.You may however, continue to take Tylenol if needed for pain up until the day of surgery. Stop ANY OVER THE COUNTER supplements/vitamins until after surgery (Vitamin D3)  TAKE ONLY THESE MEDICATIONS THE MORNING OF SURGERY WITH A SIP OF WATER: -simvastatin (ZOCOR)  -pantoprazole (PROTONIX)-take one the night before and one on the morning of surgery - helps to prevent nausea after surgery.).  Stop your metFORMIN (GLUCOPHAGE-XR) 2 days prior to surgery-Last dose will be on 12-03-22 Saturday  Stop your 81 mg Aspirin 5 days prior to surgery per Dr  Wannetta Sender dose will be on 11-30-22 Wednesday                                                                                                                                                                                      No Alcohol for 24 hours before or after surgery.  No Smoking including e-cigarettes for 24 hours before surgery.  No chewable tobacco products for at least 6 hours before surgery.  No nicotine patches on the day of surgery.  Do not use any "recreational" drugs for at least a week (preferably 2 weeks) before your surgery.  Please be advised that the combination of cocaine and anesthesia may have negative outcomes, up to and including death. If you test positive for cocaine, your surgery will be cancelled.  On the morning of surgery brush your teeth with toothpaste and water, you may rinse your mouth with mouthwash if you wish. Do not swallow any toothpaste or mouthwash.  Use CHG Soap as directed on instruction sheet.  Do not wear jewelry, make-up, hairpins, clips or nail polish.  Do not wear lotions, powders, or perfumes.   Do not shave body hair from the neck down 48 hours before surgery.  Contact lenses, hearing aids and dentures may not be worn into surgery.  Do not bring valuables to the hospital. [facility] is not responsible for any missing/lost belongings or valuables.    Notify your doctor if there is any change in your medical condition (cold, fever, infection).  Wear comfortable clothing (specific to your surgery type) to the hospital.  After surgery, you can help prevent lung complications by doing breathing exercises.  Take deep breaths and cough every 1-2 hours. Your doctor may order a device called an Incentive Spirometer to help you take deep breaths. When coughing or sneezing, hold a pillow firmly against your incision with both hands. This is called "splinting." Doing this helps protect your incision. It also decreases belly discomfort.  If  you are being admitted to the hospital overnight, leave your suitcase in the car. After surgery it may be brought to your room.  In case of increased patient census, it may be necessary for you, the patient, to continue your postoperative care in the Same Day Surgery department.  If you are being discharged the day of surgery, you will not be allowed to drive home. You will need a responsible individual to drive you home and stay with you for 24 hours after surgery.   If you are taking public transportation, you will need to have a responsible individual with you.  Please call the [facility] Dept. at [phone] if you have any questions about these instructions.  Surgery Visitation Policy:  Patients undergoing a surgery or procedure may have two family members or support persons with them as long as the person is not COVID-19 positive or experiencing its symptoms.   Inpatient Visitation:    Visiting hours are 7 a.m. to 8 p.m. Up to four visitors are allowed at one time in a patient room. The visitors may rotate out with other people during the day. One designated support person (adult) may remain overnight.  Due to an increase in RSV and influenza rates and associated hospitalizations, children ages 8 and under will not be able to visit patients in [facility]. Masks continue to be strongly recommended.      Preparing for Surgery with CHLORHEXIDINE GLUCONATE (CHG) Soap  Chlorhexidine Gluconate (CHG) Soap  o An antiseptic cleaner that kills germs and bonds with the skin to continue killing germs even after washing  o Used for showering the night before surgery and morning of surgery  Before surgery, you can play an important role by reducing the number of germs on your skin.  CHG (Chlorhexidine gluconate) soap is an antiseptic cleanser which kills germs and bonds with the skin to continue killing germs even after washing.  Please do not use if you have  an allergy to CHG or antibacterial soaps. If your skin becomes reddened/irritated stop using the CHG.  1. Shower the NIGHT BEFORE SURGERY and the MORNING OF SURGERY with CHG soap.  2. If you choose to wash your hair, wash your hair first as usual with your normal shampoo.  3. After shampooing, rinse your  hair and body thoroughly to remove the shampoo.  4. Use CHG as you would any other liquid soap. You can apply CHG directly to the skin and wash gently with a scrungie or a clean washcloth.  5. Apply the CHG soap to your body only from the neck down. Do not use on open wounds or open sores. Avoid contact with your eyes, ears, mouth, and genitals (private parts). Wash face and genitals (private parts) with your normal soap.  6. Wash thoroughly, paying special attention to the area where your surgery will be performed.  7. Thoroughly rinse your body with warm water.  8. Do not shower/wash with your normal soap after using and rinsing off the CHG soap.  9. Pat yourself dry with a clean towel.  10. Wear clean pajamas to bed the night before surgery.  12. Place clean sheets on your bed the night of your first shower and do not sleep with pets.  13. Shower again with the CHG soap on the day of surgery prior to arriving at the hospital.  14. Do not apply any deodorants/lotions/powders.  15. Please wear clean clothes to the hospital.  How to Use an Incentive Spirometer An incentive spirometer is a tool that measures how well you are filling your lungs with each breath. Learning to take long, deep breaths using this tool can help you keep your lungs clear and active. This may help to reverse or lessen your chance of developing breathing (pulmonary) problems, especially infection. You may be asked to use a spirometer: After a surgery. If you have a lung problem or a history of smoking. After a long period of time when you have been unable to move or be active. If the spirometer includes an  indicator to show the highest number that you have reached, your health care provider or respiratory therapist will help you set a goal. Keep a log of your progress as told by your health care provider. What are the risks? Breathing too quickly may cause dizziness or cause you to pass out. Take your time so you do not get dizzy or light-headed. If you are in pain, you may need to take pain medicine before doing incentive spirometry. It is harder to take a deep breath if you are having pain. How to use your incentive spirometer  Sit up on the edge of your bed or on a chair. Hold the incentive spirometer so that it is in an upright position. Before you use the spirometer, breathe out normally. Place the mouthpiece in your mouth. Make sure your lips are closed tightly around it. Breathe in slowly and as deeply as you can through your mouth, causing the piston or the ball to rise toward the top of the chamber. Hold your breath for 3-5 seconds, or for as long as possible. If the spirometer includes a coach indicator, use this to guide you in breathing. Slow down your breathing if the indicator goes above the marked areas. Remove the mouthpiece from your mouth and breathe out normally. The piston or ball will return to the bottom of the chamber. Rest for a few seconds, then repeat the steps 10 or more times. Take your time and take a few normal breaths between deep breaths so that you do not get dizzy or light-headed. Do this every 1-2 hours when you are awake. If the spirometer includes a goal marker to show the highest number you have reached (best effort), use this as a goal to work  toward during each repetition. After each set of 10 deep breaths, cough a few times. This will help to make sure that your lungs are clear. If you have an incision on your chest or abdomen from surgery, place a pillow or a rolled-up towel firmly against the incision when you cough. This can help to reduce pain while  taking deep breaths and coughing. General tips When you are able to get out of bed: Walk around often. Continue to take deep breaths and cough in order to clear your lungs. Keep using the incentive spirometer until your health care provider says it is okay to stop using it. If you have been in the hospital, you may be told to keep using the spirometer at home. Contact a health care provider if: You are having difficulty using the spirometer. You have trouble using the spirometer as often as instructed. Your pain medicine is not giving enough relief for you to use the spirometer as told. You have a fever. Get help right away if: You develop shortness of breath. You develop a cough with bloody mucus from the lungs. You have fluid or blood coming from an incision site after you cough. Summary An incentive spirometer is a tool that can help you learn to take long, deep breaths to keep your lungs clear and active. You may be asked to use a spirometer after a surgery, if you have a lung problem or a history of smoking, or if you have been inactive for a long period of time. Use your incentive spirometer as instructed every 1-2 hours while you are awake. If you have an incision on your chest or abdomen, place a pillow or a rolled-up towel firmly against your incision when you cough. This will help to reduce pain. Get help right away if you have shortness of breath, you cough up bloody mucus, or blood comes from your incision when you cough. This information is not intended to replace advice given to you by your health care provider. Make sure you discuss any questions you have with your health care provider. Document Revised: 12/16/2019 Document Reviewed: 12/16/2019 Elsevier Patient Education  [place].

## 2022-11-25 ENCOUNTER — Encounter: Payer: Self-pay | Admitting: Surgery

## 2022-11-28 ENCOUNTER — Encounter
Admission: RE | Admit: 2022-11-28 | Discharge: 2022-11-28 | Disposition: A | Discharge status: Home / Self Care | Payer: 59 | Source: Ambulatory Visit | Attending: Surgery | Admitting: Surgery

## 2022-11-28 DIAGNOSIS — Z01818 Encounter for other preprocedural examination: Secondary | ICD-10-CM | POA: Diagnosis not present

## 2022-11-28 DIAGNOSIS — Z01812 Encounter for preprocedural laboratory examination: Secondary | ICD-10-CM

## 2022-11-28 DIAGNOSIS — E119 Type 2 diabetes mellitus without complications: Secondary | ICD-10-CM | POA: Diagnosis not present

## 2022-11-28 DIAGNOSIS — Z01810 Encounter for preprocedural cardiovascular examination: Secondary | ICD-10-CM

## 2022-11-28 LAB — SURGICAL PCR SCREEN
MRSA, PCR: NEGATIVE
Staphylococcus aureus: NEGATIVE

## 2022-11-28 LAB — URINALYSIS, ROUTINE W REFLEX MICROSCOPIC
Bilirubin Urine: NEGATIVE
Glucose, UA: 50 mg/dL — AB
Hgb urine dipstick: NEGATIVE
Ketones, ur: NEGATIVE mg/dL
Leukocytes,Ua: NEGATIVE
Nitrite: NEGATIVE
Protein, ur: NEGATIVE mg/dL
Specific Gravity, Urine: 1.021 (ref 1.005–1.030)
pH: 7 (ref 5.0–8.0)

## 2022-11-28 LAB — COMPREHENSIVE METABOLIC PANEL
ALT: 16 U/L (ref 0–44)
AST: 16 U/L (ref 15–41)
Albumin: 3.5 g/dL (ref 3.5–5.0)
Alkaline Phosphatase: 97 U/L (ref 38–126)
Anion gap: 7 (ref 5–15)
BUN: 16 mg/dL (ref 8–23)
CO2: 28 mmol/L (ref 22–32)
Calcium: 9 mg/dL (ref 8.9–10.3)
Chloride: 107 mmol/L (ref 98–111)
Creatinine, Ser: 0.73 mg/dL (ref 0.44–1.00)
GFR, Estimated: >60 mL/min (ref >60)
Glucose, Bld: 174 mg/dL — ABNORMAL HIGH (ref 70–99)
Potassium: 4 mmol/L (ref 3.5–5.1)
Sodium: 142 mmol/L (ref 135–145)
Total Bilirubin: 0.5 mg/dL (ref 0.3–1.2)
Total Protein: 7.2 g/dL (ref 6.5–8.1)

## 2022-11-28 LAB — TYPE AND SCREEN
ABO/RH(D): B POS
Antibody Screen: NEGATIVE

## 2022-11-28 LAB — CBC
HCT: 39.9 % (ref 36.0–46.0)
Hemoglobin: 12.1 g/dL (ref 12.0–15.0)
MCH: 22.3 pg — ABNORMAL LOW (ref 26.0–34.0)
MCHC: 30.3 g/dL (ref 30.0–36.0)
MCV: 73.5 fL — ABNORMAL LOW (ref 80.0–100.0)
Platelets: 322 10*3/uL (ref 150–400)
RBC: 5.43 MIL/uL — ABNORMAL HIGH (ref 3.87–5.11)
RDW: 14.6 % (ref 11.5–15.5)
WBC: 4.6 10*3/uL (ref 4.0–10.5)
nRBC: 0 % (ref 0.0–0.2)

## 2022-11-28 LAB — HEMOGLOBIN A1C
Hgb A1c MFr Bld: 6.9 % — ABNORMAL HIGH (ref 4.8–5.6)
Mean Plasma Glucose: 151.33 mg/dL

## 2022-11-30 ENCOUNTER — Other Ambulatory Visit: Payer: Self-pay | Admitting: Internal Medicine

## 2022-11-30 DIAGNOSIS — K219 Gastro-esophageal reflux disease without esophagitis: Secondary | ICD-10-CM

## 2022-12-05 MED ORDER — CEFAZOLIN SODIUM-DEXTROSE 2-4 GM/100ML-% IV SOLN
2.0000 g | INTRAVENOUS | Status: AC
  Administered 2022-12-06: 2 g via INTRAVENOUS

## 2022-12-05 MED ORDER — CHLORHEXIDINE GLUCONATE 0.12 % MT SOLN: 15.0000 mL | Freq: Once | OROMUCOSAL | Status: AC

## 2022-12-05 MED ORDER — ORAL CARE MOUTH RINSE: 15.0000 mL | Freq: Once | OROMUCOSAL | Status: AC

## 2022-12-05 MED ORDER — SODIUM CHLORIDE 0.9 % IV SOLN: INTRAVENOUS | Status: DC

## 2022-12-06 ENCOUNTER — Ambulatory Visit
Admission: RE | Admit: 2022-12-06 | Discharge: 2022-12-07 | Disposition: A | Discharge status: Home / Self Care | Payer: 59 | Source: Ambulatory Visit | Attending: Surgery | Admitting: Surgery

## 2022-12-06 ENCOUNTER — Encounter
Admission: RE | Disposition: A | Discharge status: Home / Self Care | Payer: Self-pay | Source: Ambulatory Visit | Attending: Surgery

## 2022-12-06 ENCOUNTER — Ambulatory Visit: Payer: 59

## 2022-12-06 ENCOUNTER — Ambulatory Visit: Payer: 59 | Admitting: Urgent Care

## 2022-12-06 ENCOUNTER — Other Ambulatory Visit: Payer: Self-pay

## 2022-12-06 ENCOUNTER — Encounter: Payer: Self-pay | Admitting: Surgery

## 2022-12-06 DIAGNOSIS — Z96652 Presence of left artificial knee joint: Secondary | ICD-10-CM | POA: Diagnosis not present

## 2022-12-06 DIAGNOSIS — Z87891 Personal history of nicotine dependence: Secondary | ICD-10-CM | POA: Insufficient documentation

## 2022-12-06 DIAGNOSIS — M1711 Unilateral primary osteoarthritis, right knee: Secondary | ICD-10-CM | POA: Diagnosis present

## 2022-12-06 DIAGNOSIS — D860 Sarcoidosis of lung: Secondary | ICD-10-CM | POA: Diagnosis not present

## 2022-12-06 DIAGNOSIS — E785 Hyperlipidemia, unspecified: Secondary | ICD-10-CM | POA: Insufficient documentation

## 2022-12-06 DIAGNOSIS — E119 Type 2 diabetes mellitus without complications: Secondary | ICD-10-CM | POA: Insufficient documentation

## 2022-12-06 DIAGNOSIS — E559 Vitamin D deficiency, unspecified: Secondary | ICD-10-CM | POA: Insufficient documentation

## 2022-12-06 DIAGNOSIS — G4730 Sleep apnea, unspecified: Secondary | ICD-10-CM | POA: Diagnosis not present

## 2022-12-06 DIAGNOSIS — Z79899 Other long term (current) drug therapy: Secondary | ICD-10-CM | POA: Diagnosis not present

## 2022-12-06 DIAGNOSIS — I10 Essential (primary) hypertension: Secondary | ICD-10-CM | POA: Diagnosis not present

## 2022-12-06 DIAGNOSIS — Z96651 Presence of right artificial knee joint: Secondary | ICD-10-CM

## 2022-12-06 DIAGNOSIS — Z01818 Encounter for other preprocedural examination: Secondary | ICD-10-CM

## 2022-12-06 HISTORY — PX: TOTAL KNEE ARTHROPLASTY: SHX125

## 2022-12-06 HISTORY — DX: Personal history of other diseases of the circulatory system: Z86.79

## 2022-12-06 LAB — GLUCOSE, CAPILLARY
Glucose-Capillary: 216 mg/dL — ABNORMAL HIGH (ref 70–99)
Glucose-Capillary: 263 mg/dL — ABNORMAL HIGH (ref 70–99)
Glucose-Capillary: 385 mg/dL — ABNORMAL HIGH (ref 70–99)
Glucose-Capillary: 355 mg/dL — ABNORMAL HIGH (ref 70–99)
Glucose-Capillary: 326 mg/dL — ABNORMAL HIGH (ref 70–99)

## 2022-12-06 SURGERY — ARTHROPLASTY, KNEE, TOTAL
Anesthesia: Spinal | Site: Knee | Laterality: Right

## 2022-12-06 MED ORDER — DIPHENHYDRAMINE HCL 12.5 MG/5ML PO ELIX: 12.5000 mg | ORAL_SOLUTION | ORAL | Status: DC | PRN

## 2022-12-06 MED ORDER — PANTOPRAZOLE SODIUM 40 MG PO TBEC
DELAYED_RELEASE_TABLET | ORAL | Status: AC
  Administered 2022-12-06: 40 mg via ORAL
  Filled 2022-12-06: qty 1

## 2022-12-06 MED ORDER — OXYCODONE HCL 5 MG PO TABS
5.0000 mg | ORAL_TABLET | Freq: Once | ORAL | Status: AC | PRN
  Administered 2022-12-06: 5 mg via ORAL

## 2022-12-06 MED ORDER — KETOROLAC TROMETHAMINE 30 MG/ML IJ SOLN
INTRAMUSCULAR | Status: AC
  Filled 2022-12-06: qty 1

## 2022-12-06 MED ORDER — FENTANYL CITRATE (PF) 100 MCG/2ML IJ SOLN
INTRAMUSCULAR | Status: AC
  Filled 2022-12-06: qty 2

## 2022-12-06 MED ORDER — KETOROLAC TROMETHAMINE 15 MG/ML IJ SOLN: 15.0000 mg | Freq: Once | INTRAMUSCULAR | Status: AC

## 2022-12-06 MED ORDER — GLYCOPYRROLATE 0.2 MG/ML IJ SOLN
INTRAMUSCULAR | Status: AC
  Filled 2022-12-06: qty 1

## 2022-12-06 MED ORDER — OXYCODONE HCL 5 MG PO TABS
5.0000 mg | ORAL_TABLET | ORAL | Status: DC | PRN
  Administered 2022-12-06 – 2022-12-07 (×3): 10 mg via ORAL

## 2022-12-06 MED ORDER — DEXAMETHASONE SODIUM PHOSPHATE 10 MG/ML IJ SOLN
INTRAMUSCULAR | Status: AC
  Filled 2022-12-06: qty 1

## 2022-12-06 MED ORDER — PROPOFOL 1000 MG/100ML IV EMUL
INTRAVENOUS | Status: AC
  Filled 2022-12-06: qty 100

## 2022-12-06 MED ORDER — APIXABAN 2.5 MG PO TABS
2.5000 mg | ORAL_TABLET | Freq: Two times a day (BID) | ORAL | Status: DC
  Administered 2022-12-07: 2.5 mg via ORAL

## 2022-12-06 MED ORDER — MIDAZOLAM HCL 2 MG/2ML IJ SOLN
INTRAMUSCULAR | Status: AC
  Filled 2022-12-06: qty 2

## 2022-12-06 MED ORDER — SODIUM CHLORIDE 0.9 % IR SOLN
Status: DC | PRN
  Administered 2022-12-06: 3000 mL

## 2022-12-06 MED ORDER — 0.9 % SODIUM CHLORIDE (POUR BTL) OPTIME
TOPICAL | Status: DC | PRN
  Administered 2022-12-06: 500 mL

## 2022-12-06 MED ORDER — CEFAZOLIN SODIUM-DEXTROSE 2-4 GM/100ML-% IV SOLN
2.0000 g | Freq: Four times a day (QID) | INTRAVENOUS | Status: AC
  Administered 2022-12-07: 2 g via INTRAVENOUS

## 2022-12-06 MED ORDER — PROPOFOL 500 MG/50ML IV EMUL
INTRAVENOUS | Status: DC | PRN
  Administered 2022-12-06: 75 ug/kg/min via INTRAVENOUS

## 2022-12-06 MED ORDER — HYDRALAZINE HCL 20 MG/ML IJ SOLN: 10.0000 mg | Freq: Four times a day (QID) | INTRAMUSCULAR | Status: DC | PRN

## 2022-12-06 MED ORDER — TRIAMCINOLONE ACETONIDE 40 MG/ML IJ SUSP
INTRAMUSCULAR | Status: AC
  Filled 2022-12-06: qty 2

## 2022-12-06 MED ORDER — DEXAMETHASONE SODIUM PHOSPHATE 10 MG/ML IJ SOLN
INTRAMUSCULAR | Status: DC | PRN
  Administered 2022-12-06: 4 mg via INTRAVENOUS

## 2022-12-06 MED ORDER — METFORMIN HCL ER 500 MG PO TB24
ORAL_TABLET | ORAL | Status: AC
  Filled 2022-12-06: qty 1

## 2022-12-06 MED ORDER — ACETAMINOPHEN 325 MG PO TABS: 325.0000 mg | ORAL_TABLET | Freq: Four times a day (QID) | ORAL | Status: DC | PRN

## 2022-12-06 MED ORDER — ONDANSETRON HCL 4 MG/2ML IJ SOLN
INTRAMUSCULAR | Status: DC | PRN
  Administered 2022-12-06: 4 mg via INTRAVENOUS

## 2022-12-06 MED ORDER — ONDANSETRON HCL 4 MG PO TABS: 4.0000 mg | ORAL_TABLET | Freq: Four times a day (QID) | ORAL | Status: DC | PRN

## 2022-12-06 MED ORDER — ONDANSETRON HCL 4 MG/2ML IJ SOLN: 4.0000 mg | Freq: Four times a day (QID) | INTRAMUSCULAR | Status: DC | PRN

## 2022-12-06 MED ORDER — OXYCODONE HCL 5 MG PO TABS
ORAL_TABLET | ORAL | Status: AC
  Filled 2022-12-06: qty 1

## 2022-12-06 MED ORDER — GLYCOPYRROLATE 0.2 MG/ML IJ SOLN
INTRAMUSCULAR | Status: DC | PRN
  Administered 2022-12-06: .1 mg via INTRAVENOUS

## 2022-12-06 MED ORDER — OXYCODONE HCL 5 MG/5ML PO SOLN: 5.0000 mg | Freq: Once | ORAL | Status: AC | PRN

## 2022-12-06 MED ORDER — KETOROLAC TROMETHAMINE 15 MG/ML IJ SOLN
INTRAMUSCULAR | Status: AC
  Administered 2022-12-06: 15 mg via INTRAVENOUS
  Filled 2022-12-06: qty 1

## 2022-12-06 MED ORDER — METOCLOPRAMIDE HCL 10 MG PO TABS: 5.0000 mg | ORAL_TABLET | Freq: Three times a day (TID) | ORAL | Status: DC | PRN

## 2022-12-06 MED ORDER — TRANEXAMIC ACID 1000 MG/10ML IV SOLN
INTRAVENOUS | Status: DC | PRN
  Administered 2022-12-06: 1000 mg via TOPICAL

## 2022-12-06 MED ORDER — VITAMIN D 25 MCG (1000 UNIT) PO TABS
ORAL_TABLET | ORAL | Status: AC
  Administered 2022-12-06: 1000 [IU] via ORAL
  Filled 2022-12-06: qty 1

## 2022-12-06 MED ORDER — ACETAMINOPHEN 500 MG PO TABS
ORAL_TABLET | ORAL | Status: AC
  Administered 2022-12-06: 1000 mg via ORAL
  Filled 2022-12-06: qty 2

## 2022-12-06 MED ORDER — BUPIVACAINE HCL (PF) 0.5 % IJ SOLN
INTRAMUSCULAR | Status: AC
  Filled 2022-12-06: qty 10

## 2022-12-06 MED ORDER — BUPIVACAINE HCL (PF) 0.5 % IJ SOLN
INTRAMUSCULAR | Status: DC | PRN
  Administered 2022-12-06: 2.5 mL

## 2022-12-06 MED ORDER — HYDROMORPHONE HCL 1 MG/ML IJ SOLN
0.2500 mg | INTRAMUSCULAR | Status: DC | PRN
  Administered 2022-12-06: 0.5 mg via INTRAVENOUS

## 2022-12-06 MED ORDER — ONDANSETRON HCL 4 MG/2ML IJ SOLN
INTRAMUSCULAR | Status: AC
  Filled 2022-12-06: qty 2

## 2022-12-06 MED ORDER — DOCUSATE SODIUM 100 MG PO CAPS
ORAL_CAPSULE | ORAL | Status: AC
  Administered 2022-12-06: 100 mg via ORAL
  Filled 2022-12-06: qty 1

## 2022-12-06 MED ORDER — INSULIN ASPART 100 UNIT/ML IJ SOLN
0.0000 [IU] | Freq: Three times a day (TID) | INTRAMUSCULAR | Status: DC
  Administered 2022-12-06: 11 [IU] via SUBCUTANEOUS
  Administered 2022-12-07: 3 [IU] via SUBCUTANEOUS

## 2022-12-06 MED ORDER — INSULIN ASPART 100 UNIT/ML IJ SOLN: 10.0000 [IU] | Freq: Once | INTRAMUSCULAR | Status: AC

## 2022-12-06 MED ORDER — GLIMEPIRIDE 1 MG PO TABS
ORAL_TABLET | ORAL | Status: AC
  Filled 2022-12-06: qty 3

## 2022-12-06 MED ORDER — PHENYLEPHRINE HCL-NACL 20-0.9 MG/250ML-% IV SOLN
INTRAVENOUS | Status: AC
  Filled 2022-12-06: qty 250

## 2022-12-06 MED ORDER — SODIUM CHLORIDE (PF) 0.9 % IJ SOLN
INTRAMUSCULAR | Status: DC | PRN
  Administered 2022-12-06: 93 mL via SURGICAL_CAVITY

## 2022-12-06 MED ORDER — ACETAMINOPHEN 10 MG/ML IV SOLN
INTRAVENOUS | Status: AC
  Filled 2022-12-06: qty 100

## 2022-12-06 MED ORDER — SODIUM CHLORIDE (PF) 0.9 % IJ SOLN: INTRAMUSCULAR | Status: DC | PRN

## 2022-12-06 MED ORDER — ACETAMINOPHEN 10 MG/ML IV SOLN
INTRAVENOUS | Status: DC | PRN
  Administered 2022-12-06: 1000 mg via INTRAVENOUS

## 2022-12-06 MED ORDER — PANTOPRAZOLE SODIUM 40 MG PO TBEC
40.0000 mg | DELAYED_RELEASE_TABLET | Freq: Every day | ORAL | Status: DC
  Administered 2022-12-07: 40 mg via ORAL

## 2022-12-06 MED ORDER — METFORMIN HCL ER 500 MG PO TB24
500.0000 mg | ORAL_TABLET | Freq: Two times a day (BID) | ORAL | Status: DC
  Administered 2022-12-06 – 2022-12-07 (×2): 500 mg via ORAL

## 2022-12-06 MED ORDER — DOCUSATE SODIUM 100 MG PO CAPS
100.0000 mg | ORAL_CAPSULE | Freq: Two times a day (BID) | ORAL | Status: DC
  Administered 2022-12-06 – 2022-12-07 (×2): 100 mg via ORAL

## 2022-12-06 MED ORDER — GLIMEPIRIDE 1 MG PO TABS
4.0000 mg | ORAL_TABLET | Freq: Two times a day (BID) | ORAL | Status: DC
  Administered 2022-12-06: 4 mg via ORAL

## 2022-12-06 MED ORDER — BUPIVACAINE LIPOSOME 1.3 % IJ SUSP
INTRAMUSCULAR | Status: AC
  Filled 2022-12-06: qty 20

## 2022-12-06 MED ORDER — DOCUSATE SODIUM 100 MG PO CAPS
ORAL_CAPSULE | ORAL | Status: AC
  Filled 2022-12-06: qty 1

## 2022-12-06 MED ORDER — METOCLOPRAMIDE HCL 5 MG/ML IJ SOLN: 5.0000 mg | Freq: Three times a day (TID) | INTRAMUSCULAR | Status: DC | PRN

## 2022-12-06 MED ORDER — CEFAZOLIN SODIUM-DEXTROSE 2-4 GM/100ML-% IV SOLN
INTRAVENOUS | Status: AC
  Administered 2022-12-06: 2 g via INTRAVENOUS
  Filled 2022-12-06: qty 100

## 2022-12-06 MED ORDER — KETOROLAC TROMETHAMINE 15 MG/ML IJ SOLN
INTRAMUSCULAR | Status: AC
  Administered 2022-12-06: 7.5 mg via INTRAVENOUS
  Filled 2022-12-06: qty 1

## 2022-12-06 MED ORDER — TRANEXAMIC ACID 1000 MG/10ML IV SOLN
INTRAVENOUS | Status: AC
  Filled 2022-12-06: qty 10

## 2022-12-06 MED ORDER — METFORMIN HCL ER 500 MG PO TB24
ORAL_TABLET | ORAL | Status: AC
  Administered 2022-12-06: 500 mg via ORAL
  Filled 2022-12-06: qty 1

## 2022-12-06 MED ORDER — VITAMIN D 25 MCG (1000 UNIT) PO TABS
1000.0000 [IU] | ORAL_TABLET | Freq: Every day | ORAL | Status: DC
  Administered 2022-12-07: 1000 [IU] via ORAL

## 2022-12-06 MED ORDER — BISACODYL 10 MG RE SUPP: 10.0000 mg | Freq: Every day | RECTAL | Status: DC | PRN

## 2022-12-06 MED ORDER — EPINEPHRINE PF 1 MG/ML IJ SOLN
INTRAMUSCULAR | Status: AC
  Filled 2022-12-06: qty 1

## 2022-12-06 MED ORDER — KETOROLAC TROMETHAMINE 15 MG/ML IJ SOLN
7.5000 mg | Freq: Four times a day (QID) | INTRAMUSCULAR | Status: AC
  Administered 2022-12-07: 7.5 mg via INTRAVENOUS

## 2022-12-06 MED ORDER — METOPROLOL TARTRATE 25 MG PO TABS
ORAL_TABLET | ORAL | Status: AC
  Filled 2022-12-06: qty 1

## 2022-12-06 MED ORDER — ACETAMINOPHEN 500 MG PO TABS
1000.0000 mg | ORAL_TABLET | Freq: Four times a day (QID) | ORAL | Status: DC
  Administered 2022-12-07: 1000 mg via ORAL

## 2022-12-06 MED ORDER — OXYCODONE HCL 5 MG PO TABS
ORAL_TABLET | ORAL | Status: AC
  Filled 2022-12-06: qty 2

## 2022-12-06 MED ORDER — INSULIN ASPART 100 UNIT/ML IJ SOLN
INTRAMUSCULAR | Status: AC
  Administered 2022-12-06: 15 [IU] via SUBCUTANEOUS
  Filled 2022-12-06: qty 1

## 2022-12-06 MED ORDER — SODIUM CHLORIDE FLUSH 0.9 % IV SOLN
INTRAVENOUS | Status: AC
  Filled 2022-12-06: qty 40

## 2022-12-06 MED ORDER — FENTANYL CITRATE (PF) 100 MCG/2ML IJ SOLN
25.0000 ug | INTRAMUSCULAR | Status: AC | PRN
  Administered 2022-12-06 (×4): 25 ug via INTRAVENOUS

## 2022-12-06 MED ORDER — LOSARTAN POTASSIUM 50 MG PO TABS: 25.0000 mg | ORAL_TABLET | ORAL | Status: DC

## 2022-12-06 MED ORDER — GLIMEPIRIDE 1 MG PO TABS
ORAL_TABLET | ORAL | Status: AC
  Filled 2022-12-06: qty 1

## 2022-12-06 MED ORDER — FLEET ENEMA 7-19 GM/118ML RE ENEM: 1.0000 | ENEMA | Freq: Once | RECTAL | Status: DC | PRN

## 2022-12-06 MED ORDER — METOPROLOL TARTRATE 25 MG PO TABS
25.0000 mg | ORAL_TABLET | Freq: Two times a day (BID) | ORAL | Status: DC
  Administered 2022-12-06 – 2022-12-07 (×2): 25 mg via ORAL

## 2022-12-06 MED ORDER — FENTANYL CITRATE (PF) 100 MCG/2ML IJ SOLN
INTRAMUSCULAR | Status: AC
  Administered 2022-12-06: 50 ug via INTRAVENOUS
  Filled 2022-12-06: qty 2

## 2022-12-06 MED ORDER — ACETAMINOPHEN 10 MG/ML IV SOLN: 1000.0000 mg | Freq: Once | INTRAVENOUS | Status: DC | PRN

## 2022-12-06 MED ORDER — SIMVASTATIN 20 MG PO TABS
20.0000 mg | ORAL_TABLET | ORAL | Status: DC
  Administered 2022-12-07: 20 mg via ORAL
  Filled 2022-12-06: qty 1

## 2022-12-06 MED ORDER — SODIUM CHLORIDE 0.9 % IV SOLN: INTRAVENOUS | Status: DC

## 2022-12-06 MED ORDER — FENTANYL CITRATE (PF) 100 MCG/2ML IJ SOLN
INTRAMUSCULAR | Status: DC | PRN
  Administered 2022-12-06: 50 ug via INTRAVENOUS
  Administered 2022-12-06 (×2): 25 ug via INTRAVENOUS

## 2022-12-06 MED ORDER — PROPOFOL 10 MG/ML IV BOLUS
INTRAVENOUS | Status: DC | PRN
  Administered 2022-12-06: 40 mg via INTRAVENOUS
  Administered 2022-12-06: 10 mg via INTRAVENOUS
  Administered 2022-12-06: 60 mg via INTRAVENOUS
  Administered 2022-12-06: 30 mg via INTRAVENOUS
  Administered 2022-12-06: 40 mg via INTRAVENOUS
  Administered 2022-12-06: 30 mg via INTRAVENOUS
  Administered 2022-12-06: 20 mg via INTRAVENOUS

## 2022-12-06 MED ORDER — MIDAZOLAM HCL 5 MG/5ML IJ SOLN
INTRAMUSCULAR | Status: DC | PRN
  Administered 2022-12-06: 2 mg via INTRAVENOUS

## 2022-12-06 MED ORDER — CHLORHEXIDINE GLUCONATE 0.12 % MT SOLN
OROMUCOSAL | Status: AC
  Administered 2022-12-06: 15 mL via OROMUCOSAL
  Filled 2022-12-06: qty 15

## 2022-12-06 MED ORDER — MAGNESIUM HYDROXIDE 400 MG/5ML PO SUSP: 30.0000 mL | Freq: Every day | ORAL | Status: DC | PRN

## 2022-12-06 MED ORDER — CEFAZOLIN SODIUM-DEXTROSE 2-4 GM/100ML-% IV SOLN
INTRAVENOUS | Status: AC
  Filled 2022-12-06: qty 100

## 2022-12-06 MED ORDER — DROPERIDOL 2.5 MG/ML IJ SOLN: 0.6250 mg | Freq: Once | INTRAMUSCULAR | Status: DC | PRN

## 2022-12-06 MED ORDER — BUPIVACAINE HCL (PF) 0.5 % IJ SOLN
INTRAMUSCULAR | Status: AC
  Filled 2022-12-06: qty 30

## 2022-12-06 MED ORDER — HYDROMORPHONE HCL 1 MG/ML IJ SOLN
INTRAMUSCULAR | Status: AC
  Filled 2022-12-06: qty 0.5

## 2022-12-06 MED ORDER — FENTANYL CITRATE (PF) 100 MCG/2ML IJ SOLN
INTRAMUSCULAR | Status: AC
  Administered 2022-12-06: 25 ug via INTRAVENOUS
  Filled 2022-12-06: qty 2

## 2022-12-06 SURGICAL SUPPLY — 63 items
APL PRP STRL LF DISP 70% ISPRP (MISCELLANEOUS) ×1
BIT DRILL QUICK REL 1/8 2PK SL (DRILL) IMPLANT
BLADE SAW SAG 25X90X1.19 (BLADE) ×1 IMPLANT
BLADE SURG SZ20 CARB STEEL (BLADE) ×1 IMPLANT
BNDG CMPR STD VLCR NS LF 5.8X6 (GAUZE/BANDAGES/DRESSINGS) ×1
BNDG ELASTIC 6X5.8 VLCR NS LF (GAUZE/BANDAGES/DRESSINGS) ×1 IMPLANT
CEMENT BONE R 1X40 (Cement) ×2 IMPLANT
CEMENT VACUUM MIXING SYSTEM (MISCELLANEOUS) ×1 IMPLANT
CHLORAPREP W/TINT 26 (MISCELLANEOUS) ×1 IMPLANT
COMPONENT PATELLAR VGD 7.8X3 (Joint) IMPLANT
COOLER POLAR GLACIER W/PUMP (MISCELLANEOUS) ×1 IMPLANT
COVER MAYO STAND REUSABLE (DRAPES) ×1 IMPLANT
CUFF TOURN SGL QUICK 24 (TOURNIQUET CUFF)
CUFF TOURN SGL QUICK 34 (TOURNIQUET CUFF)
CUFF TRNQT CYL 24X4X16.5-23 (TOURNIQUET CUFF) IMPLANT
CUFF TRNQT CYL 34X4.125X (TOURNIQUET CUFF) IMPLANT
DRAPE 3/4 80X56 (DRAPES) ×1 IMPLANT
DRAPE IMP U-DRAPE 54X76 (DRAPES) ×1 IMPLANT
DRAPE U-SHAPE 47X51 STRL (DRAPES) ×1 IMPLANT
DRILL QUICK RELEASE 1/8 INCH (DRILL) ×3
DRSG MEPILEX SACRM 8.7X9.8 (GAUZE/BANDAGES/DRESSINGS) IMPLANT
DRSG OPSITE POSTOP 4X10 (GAUZE/BANDAGES/DRESSINGS) ×1 IMPLANT
DRSG OPSITE POSTOP 4X8 (GAUZE/BANDAGES/DRESSINGS) ×1 IMPLANT
ELECT REM PT RETURN 9FT ADLT (ELECTROSURGICAL) ×1
ELECTRODE REM PT RTRN 9FT ADLT (ELECTROSURGICAL) ×1 IMPLANT
FEMORAL CR RIGHT 67.5MM (Joint) IMPLANT
GAUZE XEROFORM 1X8 LF (GAUZE/BANDAGES/DRESSINGS) ×1 IMPLANT
GLOVE BIO SURGEON STRL SZ7.5 (GLOVE) ×4 IMPLANT
GLOVE BIO SURGEON STRL SZ8 (GLOVE) ×4 IMPLANT
GLOVE BIOGEL PI IND STRL 8 (GLOVE) ×1 IMPLANT
GLOVE SURG UNDER LTX SZ8 (GLOVE) ×1 IMPLANT
GOWN STRL REUS W/ TWL LRG LVL3 (GOWN DISPOSABLE) ×1 IMPLANT
GOWN STRL REUS W/ TWL XL LVL3 (GOWN DISPOSABLE) ×1 IMPLANT
GOWN STRL REUS W/TWL LRG LVL3 (GOWN DISPOSABLE) ×1
GOWN STRL REUS W/TWL XL LVL3 (GOWN DISPOSABLE) ×1
HANDLE YANKAUER SUCT OPEN TIP (MISCELLANEOUS) ×1 IMPLANT
HOOD PEEL AWAY T7 (MISCELLANEOUS) ×3 IMPLANT
INSERT TIB BEARING 71X14 (Insert) IMPLANT
IV NS IRRIG 3000ML ARTHROMATIC (IV SOLUTION) ×1 IMPLANT
KIT TURNOVER KIT A (KITS) ×1 IMPLANT
MANIFOLD NEPTUNE II (INSTRUMENTS) ×1 IMPLANT
NDL SPNL 20GX3.5 QUINCKE YW (NEEDLE) ×1 IMPLANT
NEEDLE SPNL 20GX3.5 QUINCKE YW (NEEDLE) ×1 IMPLANT
NS IRRIG 1000ML POUR BTL (IV SOLUTION) ×1 IMPLANT
PACK TOTAL KNEE (MISCELLANEOUS) ×1 IMPLANT
PAD WRAPON POLAR KNEE (MISCELLANEOUS) ×1 IMPLANT
PENCIL SMOKE EVACUATOR (MISCELLANEOUS) ×1 IMPLANT
PLATE KNEE TIBIAL 71MM FIXED (Plate) IMPLANT
PULSAVAC PLUS IRRIG FAN TIP (DISPOSABLE) ×1
STAPLER SKIN PROX 35W (STAPLE) ×1 IMPLANT
SUCTION FRAZIER HANDLE 10FR (MISCELLANEOUS) ×1
SUCTION TUBE FRAZIER 10FR DISP (MISCELLANEOUS) ×1 IMPLANT
SUT VIC AB 0 CT1 36 (SUTURE) ×3 IMPLANT
SUT VIC AB 2-0 CT1 (SUTURE) IMPLANT
SUT VIC AB 2-0 CT1 27 (SUTURE) ×3
SUT VIC AB 2-0 CT1 TAPERPNT 27 (SUTURE) ×3 IMPLANT
SYR 10ML LL (SYRINGE) ×1 IMPLANT
SYR 20ML LL LF (SYRINGE) ×1 IMPLANT
SYR 30ML LL (SYRINGE) IMPLANT
TIP FAN IRRIG PULSAVAC PLUS (DISPOSABLE) ×1 IMPLANT
TRAP FLUID SMOKE EVACUATOR (MISCELLANEOUS) ×2 IMPLANT
WATER STERILE IRR 500ML POUR (IV SOLUTION) ×1 IMPLANT
WRAPON POLAR PAD KNEE (MISCELLANEOUS) ×1

## 2022-12-06 NOTE — Anesthesia Postprocedure Evaluation (Signed)
Anesthesia Post Note  Patient: Judy Barber  Procedure(s) Performed: TOTAL KNEE ARTHROPLASTY (Right: Knee)  Patient location during evaluation: PACU Anesthesia Type: Spinal Level of consciousness: awake and alert Pain management: pain level controlled Vital Signs Assessment: post-procedure vital signs reviewed and stable Respiratory status: spontaneous breathing, nonlabored ventilation and respiratory function stable Cardiovascular status: blood pressure returned to baseline and stable Postop Assessment: no apparent nausea or vomiting Anesthetic complications: no   No notable events documented.   Last Vitals:  Vitals:   12/06/22 1430 12/06/22 1511  BP: (!) 158/72 (!) 163/75  Pulse: 97 92  Resp: 15 16  Temp:  36.4 C  SpO2: 94% 96%    Last Pain:  Vitals:   12/06/22 1544  TempSrc:   PainSc: [place]

## 2022-12-06 NOTE — Transfer of Care (Signed)
Immediate Anesthesia Transfer of Care Note  Patient: Judy Barber  Procedure(s) Performed: TOTAL KNEE ARTHROPLASTY (Right: Knee)  Patient Location: PACU  Anesthesia Type:General and Spinal  Level of Consciousness: awake, alert , and oriented  Airway & Oxygen Therapy: Patient Spontanous Breathing and Patient connected to nasal cannula oxygen  Post-op Assessment: Report given to RN and Post -op Vital signs reviewed and stable  Post vital signs: Reviewed and stable  Last Vitals:  Vitals Value Taken Time  BP 139/82 12/06/22 1330  Temp 36.7 C 12/06/22 1330  Pulse 116 12/06/22 1334  Resp 29 12/06/22 1334  SpO2 94 % 12/06/22 1334  Vitals shown include unvalidated device data.  Last Pain:  Vitals:   12/06/22 1330  TempSrc:   PainSc: 0-No pain         Complications: No notable events documented.

## 2022-12-06 NOTE — Anesthesia Procedure Notes (Signed)
Spinal  Patient location during procedure: OR Start time: 12/06/2022 11:10 AM End time: 12/06/2022 11:17 AM Reason for block: surgical anesthesia Staffing Performed: anesthesiologist and resident/CRNA  Anesthesiologist: Iran Ouch, MD Resident/CRNA: Bynum, Niger, CRNA Performed by: Iran Ouch, MD Authorized by: Iran Ouch, MD   Preanesthetic Checklist Completed: patient identified, IV checked, site marked, risks and benefits discussed, surgical consent, monitors and equipment checked, pre-op evaluation and timeout performed Spinal Block Patient position: sitting Prep: DuraPrep Patient monitoring: heart rate, cardiac monitor, continuous pulse ox and blood pressure Approach: midline Location: L3-4 Injection technique: single-shot Needle Needle type: Pencan  Needle gauge: 24 G Needle length: 9 cm Assessment Sensory level: T10 Events: paresthesia, CSF return and second provider Additional Notes First attempt by [place], CRNA at L4/5. Second attempt at L3/4 by Wynetta Emery, MD. CSF slow to return. No persistent paresthesia noted.

## 2022-12-06 NOTE — Evaluation (Signed)
Physical Therapy Evaluation Patient Details Name: Judy Barber MRN: [id number] DOB: 1956-06-30 Today's Date: 12/06/2022  History of Present Illness  67 y/o ferm[place] s/p R TKA 2/27.  Clinical Impression  Pt having expected post-op pain but generally did well with POD0 PT eval and treat.  She needed some light assist with bed mobility and transfers, but was able to ambulate ~50 ft with slow, but safe effort.  Highly reliant on the walker with c/o 9/10 pain with WBing, though she was less hesitant and guarded by the end of the effort showing increased WBing tolerance and confidence with the effort.  Pt showed good effort with LE exercises, PROM flexion to mid 70s.  Pt will benefit from continued PT to address functional limitations and progress per TKA protocol.       Recommendations for follow up therapy are one component of a multi-disciplinary discharge planning process, led by the attending physician.  Recommendations may be updated based on patient status, additional functional criteria and insurance authorization.  Follow Up Recommendations Follow physician's recommendations for discharge plan and follow up therapies      Assistance Recommended at Discharge Frequent or constant Supervision/Assistance  Patient can return home with the following  A little help with walking and/or transfers;A little help with bathing/dressing/bathroom;Assistance with cooking/housework;Assist for transportation    Equipment Recommendations BSC/3in1  Recommendations for Other Services       Functional Status Assessment Patient has had a recent decline in their functional status and demonstrates the ability to make significant improvements in function in a reasonable and predictable amount of time.     Precautions / Restrictions Precautions Precautions: Fall Restrictions Weight Bearing Restrictions: Yes RLE Weight Bearing: Weight bearing as tolerated      Mobility  Bed Mobility Overal bed mobility:  Needs Assistance Bed Mobility: Supine to Sit     Supine to sit: Min assist     General bed mobility comments: Pt able to do most of the effort, but did need UEs to help move R LE, PT did ultimately assist R LE to move to complete the transition to EOB    Transfers Overall transfer level: Needs assistance Equipment used: Rolling walker (2 wheels) Transfers: Sit to/from Stand Sit to Stand: Min assist, Min guard           General transfer comment: Pt struggled to get R LE under herself enough to really shift weight onto it during transitions to standing, with slightly elevated bed height and heavy cuing for UE use/positioning she was able to rise with only min assist.    Ambulation/Gait Ambulation/Gait assistance: Supervision Gait Distance (Feet): 50 Feet Assistive device: Rolling walker (2 wheels)         General Gait Details: very slow and guarded gait that did minimally improve with increased cuing and time in upright.  She was reliant on the walker but was able to take more weight on the R by the end of the effort.  Stairs            Wheelchair Mobility    Modified Rankin (Stroke Patients Only)       Balance Overall balance assessment: Needs assistance Sitting-balance support: No upper extremity supported Sitting balance-Leahy Scale: Good     Standing balance support: Bilateral upper extremity supported Standing balance-Leahy Scale: Fair Standing balance comment: highly reliant on walker, no LOBs  Pertinent Vitals/Pain Pain Assessment Pain Assessment: 0-10 Pain Score: 9  Pain Location: R knee    Home Living Family/patient expects to be discharged to:: Private residence Living Arrangements: Other relatives (niece) Available Help at Discharge: Family   Home Access: Stairs to enter Entrance Stairs-Rails: None Entrance Stairs-Number of Steps: 1 (reports step may be torn up right now 2/2 repairs)     Home  Equipment: Rolling Walker (2 wheels)      Prior Function Prior Level of Function : Independent/Modified Independent;Working/employed             Mobility Comments: independent, works at [employer] ADLs Comments: independent     Journalist, newspaper        Extremity/Trunk Assessment   Upper Extremity Assessment Upper Extremity Assessment: Overall WFL for tasks assessed    Lower Extremity Assessment Lower Extremity Assessment: Overall WFL for tasks assessed (expected post-op weakness, can do R SLRs)       Communication   Communication: No difficulties  Cognition Arousal/Alertness: Awake/alert Behavior During Therapy: WFL for tasks assessed/performed Overall Cognitive Status: Within Functional Limits for tasks assessed                                          General Comments      Exercises Total Joint Exercises Ankle Circles/Pumps: AROM, 10 reps Quad Sets: Strengthening, 10 reps Short Arc Quad: AROM, 10 reps Heel Slides: AROM, 10 reps Hip ABduction/ADduction: Strengthening, 10 reps Straight Leg Raises: AROM, 10 reps Knee Flexion: PROM, 5 reps Goniometric ROM: 0-74   Assessment/Plan    PT Assessment Patient needs continued PT services  PT Problem List Decreased strength;Decreased range of motion;Decreased activity tolerance;Decreased balance;Decreased safety awareness;Decreased knowledge of use of DME;Decreased mobility;Pain       PT Treatment Interventions DME instruction;Gait training;Stair training;Functional mobility training;Therapeutic activities;Therapeutic exercise;Balance training;Patient/family education    PT Goals (Current goals can be found in the Care Plan section)  Acute Rehab PT Goals Patient Stated Goal: Go home PT Goal Formulation: With patient Time For Goal Achievement: 12/19/22 Potential to Achieve Goals: Good    Frequency BID     Co-evaluation               AM-PAC PT "6 Clicks" Mobility  Outcome Measure Help  needed turning from your back to your side while in a flat bed without using bedrails?: A Little Help needed moving from lying on your back to sitting on the side of a flat bed without using bedrails?: A Little Help needed moving to and from a bed to a chair (including a wheelchair)?: A Little Help needed standing up from a chair using your arms (e.g., wheelchair or bedside chair)?: A Little Help needed to walk in hospital room?: A Little Help needed climbing 3-5 steps with a railing? : A Little 6 Click Score: 18    End of Session Equipment Utilized During Treatment: Gait belt Activity Tolerance: Patient tolerated treatment well Patient left: with call bell/phone within reach;in chair Nurse Communication: Mobility status PT Visit Diagnosis: Muscle weakness (generalized) (M62.81);Difficulty in walking, not elsewhere classified (R26.2);Pain Pain - Right/Left: Right Pain - part of body: Knee    Time: 1640-1720 PT Time Calculation (min) (ACUTE ONLY): 40 min   Charges:   PT Evaluation $PT Eval Low Complexity: 1 Low PT Treatments $Gait Training: 8-22 mins $Therapeutic Exercise: 8-22 mins  Kreg Shropshire, DPT 12/06/2022, 6:23 PM

## 2022-12-06 NOTE — Op Note (Signed)
12/06/2022  1:25 PM  Patient:   Judy Barber  Pre-Op Diagnosis:   Degenerative joint disease, right knee.  Post-Op Diagnosis:   Same  Procedure:   Right TKA using all-cemented Bi[employer] Vanguard system with a 67.5 mm mm PCR femur, a 71 mm tibial tray with a 14 mm anterior stabilized E-poly insert, and a 34 x 7.8 mm all-poly 3-pegged domed patella.  Surgeon:   Pascal Lux, MD  Assistant:   Cameron Proud, PA-C   Anesthesia:   Spinal  Findings:   As above  Complications:   None  EBL:   15 cc  Fluids:   600 cc crystalloid  UOP:   None  TT:   79 minutes at 300 mmHg  Drains:   None  Closure:   Staples  Implants:   As above  Brief Clinical Note:   The patient is a 67 year old female with a long history of progressively worsening right knee pain. The patient's symptoms have progressed despite medications, activity modification, injections, etc. The patient's history and examination were consistent with advanced degenerative joint disease of the right knee confirmed by plain radiographs. The patient presents at this time for a right total knee arthroplasty.  Procedure:   The patient was brought into the operating room. After adequate spinal anesthesia was obtained, the patient was lain in the supine position before the right lower extremity was prepped with ChloraPrep solution and draped sterilely. Preoperative antibiotics were administered. A timeout was performed to verify the appropriate surgical site before the limb was exsanguinated with an Esmarch and the tourniquet inflated to 300 mmHg.   A standard anterior approach to the knee was made through an approximately 6-7 inch incision. The incision was carried down through the subcutaneous tissues to expose superficial retinaculum. This was split the length of the incision and the medial flap elevated sufficiently to expose the medial retinaculum. The medial retinaculum was incised, leaving a 3-4 mm cuff of tissue on the patella. This  was extended distally along the medial border of the patellar tendon and proximally through the medial third of the quadriceps tendon. A subtotal fat pad excision was performed before the soft tissues were elevated off the anteromedial and anterolateral aspects of the proximal tibia to the level of the collateral ligaments. The anterior portions of the medial and lateral menisci were removed, as was the anterior cruciate ligament. With the knee flexed to 90, the external tibial guide was positioned and the appropriate proximal tibial cut made. This piece was taken to the back table where it was measured and found to be optimally replicated by a 71 mm component.  Attention was directed to the distal femur. The intramedullary canal was accessed through a 3/8" drill hole. The intramedullary guide was inserted and positioned in order to obtain a neutral flexion gap. The intercondylar block was positioned with care taken to avoid notching the anterior cortex of the femur. The appropriate cut was made. Next, the distal cutting block was placed at 5 of valgus alignment. Using the 9 mm slot, the distal cut was made. The distal femur was measured and found to be optimally replicated by the [id number] mm component. The 67.5 mm 4-in-1 cutting block was positioned and first the posterior, then the posterior chamfer, the anterior chamfer, and finally the anterior cuts were made. At this point, the posterior portions medial and lateral menisci were removed. A trial reduction was performed using the appropriate femoral and tibial components with first the 10 mm,  then the 12 mm, and finally the 14 mm insert. The 14 mm insert demonstrated excellent stability to varus and valgus stressing both in flexion and extension while permitting full extension. Patella tracking was assessed and found to be excellent. Therefore, the tibial guide position was marked on the proximal tibia. The patella thickness was measured and found to be 23 mm.  Therefore, the appropriate cut was made. The patellar surface was measured and found to be optimally replicated by the 34 mm component. The three peg holes were drilled in place before the trial button was inserted. Patella tracking was assessed and found to be excellent, passing the "no thumb test". The lug holes were drilled into the distal femur before the trial component was removed, leaving only the tibial tray. The keel was then created using the appropriate tower, reamer, and punch.  The bony surfaces were prepared for cementing by irrigating them thoroughly with sterile saline solution via the jet lavage system. A bone plug was fashioned from some of the bone that had been removed previously and used to plug the distal femoral canal. In addition, 20 cc of Exparel diluted out to 60 cc with normal saline and 30 cc of 0.5% Sensorcaine were injected into the postero-medial and postero-lateral aspects of the knee, the medial and lateral gutter regions, and the peri-incisional tissues to help with postoperative analgesia. Meanwhile, the cement was being mixed on the back table. When it was ready, the tibial tray was cemented in first. The excess cement was removed using [employer] fast streamer. Next, the femoral component was impacted into place. Again, the excess cement was removed using [employer] fast streamer. The 14 mm trial insert was positioned and the knee brought into extension while the cement hardened. Finally, the patella was cemented into place and secured using the patellar clamp. Again, the excess cement was removed using [employer] fast streamer. Once the cement had hardened, the knee was placed through a range of motion with the findings as described above. Therefore, the trial insert was removed and, after verifying that no cement had been retained posteriorly, the permanent 14 mm anterior stabilized E-polyethylene insert was positioned and secured using the appropriate key locking mechanism. Again the knee was placed  through a range of motion with the findings as described above.  The wound was copiously irrigated with sterile saline solution using the jet lavage system before the quadriceps tendon and retinacular layer were reapproximated using #0 Vicryl interrupted sutures. The superficial retinacular layer also was closed using a running #0 Vicryl suture. A total of 10 cc of transexemic acid (TXA) was injected intra-articularly before the subcutaneous tissues were closed in several layers using 2-0 Vicryl interrupted sutures. The skin was closed using staples. A sterile honeycomb dressing was applied to the skin before the leg was wrapped with an Ace wrap to accommodate the Polar Care device. The patient was then awakened and returned to the recovery room in satisfactory condition after tolerating the procedure well.

## 2022-12-06 NOTE — H&P (Signed)
History of Present Illness: Judy Barber is a 67 y.o. who presents today for a history and physical. She is to undergo a right total knee arthroplasty on 12/06/2022. Since her last visit in the clinic she has not seen any improvement in her condition and wishes to continue to proceed with surgery.  Patient has a long history of bilateral knee pain. Did undergo a unicompartmental left knee placement Feb 17, 2022. Is done well with that knee but still having some discomfort to the lateral tibial plateau. However she is continue to have discomfort with the right knee and has had several cortisone injections as well as arthroscopic surgery to the right knee. A MRI of the right knee was performed to determine if a partial knee replacement could be performed.The patient notes little change in her symptoms since her last visit. She continues to complain of moderate pain in her knee which she rates at 4/10 on today's visit. She continues to take Mobic and Flexeril as necessary with limited benefit. Her symptoms are worse with any prolonged standing or ambulation. She also has pain at night and with ascending/descending stairs. She denies any recent reinjury to the knee, and denies any numbness or paresthesias down her leg to her foot. Since her last visit, she has undergone an MRI scan and presents today to review these results.   Past Medical History: Diabetes mellitus without complication (CMS-HCC)  Hyperlipidemia  Hypertension  Primary osteoarthritis of left knee 10/19/2021  Sarcoidosis 2005 s/p lung biopsy  Strabismus   Past Surgical History: APPENDECTOMY 1980 (ruptured appendix)  BREAST EXCISIONAL BIOPSY Right 2013  benign  Left breast biopsy 04/2016  ARTHROSCOPY KNEE W/MENISCECTOMY Right 03/01/2017  Procedure: ARTHROSCOPY, KNEE W/ MENISCECTOMY INCL DEBRIDEMENT/SHAVING ARTICULAR CARTILAGE (CHONDROPLASTY); Surgeon: Margret Chance, MD; Location: [facility]; Service: Orthopedics; Laterality: Right;   COLONOSCOPY W/BIOPSY N/A 08/21/2018  Procedure: COLONOSCOPY, FLEXIBLE; WITH BIOPSY, SINGLE OR MULTIPLE; Surgeon: Don Perking, MD; Location: [facility]; Service: Gastroenterology; Laterality: N/A;  Left unicondylar knee arthroplasty 02/17/2022 (Dr. Roland Rack)  ABDOMINAL SURGERY  tubal ligation  BREAST SURGERY (biopsy)  CARDIAC SURGERY (ablation, early 2000s)  PERCUTANEOUS BIOPSY LUNG (bx 2005 diagnosis of sarcoidosis)  TUBAL LIGATION   Past Family History: Diabetes type II Mother  Diabetes type II Father  Stroke Father  Breast cancer Cousin  Ovarian cancer Cousin  Glaucoma Neg Hx   Medications: alendronate (FOSAMAX) 70 MG tablet Take 70 mg by mouth once a week  aspirin 81 MG EC tablet Take 81 mg by mouth once daily.  benzonatate (TESSALON) 100 MG capsule Take 1 capsule (100 mg total) by mouth 3 (three) times daily  cholecalciferol (VITAMIN D3) 1000 unit tablet Take 1,000 Units by mouth daily.  meloxicam (MOBIC) 15 MG tablet Take 1 tablet (15 mg total) by mouth once daily 15 tablet 0  pantoprazole (PROTONIX) 40 MG DR tablet Take 40 mg by mouth once daily  blood glucose diagnostic test strip Use 1 each (1 strip total) 2 (two) times daily E11.9 Use as instructed. 100 each 12  blood glucose meter kit Use as directed 1 each 0  ERGOCALCIFEROL, VITAMIN D2, (VITAMIN D3 ORAL) Take 1,000 Units by mouth every morning  lancing device with lancets kit Use 1 each 2 (two) times daily 100 each 12  losartan (COZAAR) 25 MG tablet Take 1 tablet (25 mg total) by mouth once daily for 338 days 90 tablet 1  simvastatin (ZOCOR) 20 MG tablet Take 1 tablet (20 mg total) by mouth nightly for 242 days  90 tablet 1   Allergies: Bee Venom Protein (Honey Bee) Swelling  Venom-Honey Bee Swelling  Wasp Venom Swelling  Yellow Jacket Venom Swelling   Review of Systems: A comprehensive 14 point ROS was performed, reviewed, and the pertinent orthopaedic findings are documented in the HPI.  Physical  Exam: BP (!) 140/72 (BP Location: Left upper arm, Patient Position: Sitting, BP Cuff Size: Large Adult)  Ht 160 cm ('5\' 3"'$ )  Wt 94.8 kg (209 lb)  BMI 37.02 kg/m   General: Well-developed well-nourished female seen in no acute distress.   HEENT: Atraumatic,normocephalic. Pupils are equal and reactive to light. Oropharynx is clear with moist mucosa. Strabismus present.  Lungs: Clear to auscultation bilaterally   Cardiovascular: Regular rate and rhythm. Normal S1, S2. No murmurs. No appreciable gallops or rubs. Peripheral pulses are palpable.  Abdomen: Soft, non-tender, nondistended. Bowel sounds present  Right knee exam: Skin inspection of the right knee again is notable for mild swelling around the knee, but otherwise is unremarkable. No erythema, ecchymosis, abrasions, or other skin abnormalities are identified. There is moderate tenderness to palpation over the medial joint line, and mild tenderness to palpation over the lateral joint line. She denies any peripatellar tenderness. Actively, she lacks 5 degrees of extension but is able to flex her knee to 115 degrees. Passively, the knee can be extended to near 0 degrees and flexed to 120 degrees. Her patella tracks well, but is notable for mild crepitance. The knee is stable to varus and valgus stressing. She remains neurovascular intact to the right lower extremity and foot.  Neurological: The patient is alert and oriented Sensation to light touch appears to be intact and within normal limits Gross motor strength appeared to be equal to 5/5  Vascular : Peripheral pulses felt to be palpable. Capillary refill appears to be intact and within normal limits Moderate swelling or edema to the right ankle and foot  X-ray: MRI performed on the right knee by report demonstrates evidence of tricompartmental degenerative changes, primarily involving the medial compartment. There is significant degenerative tearing of the medial meniscus with  extrusion peripherally. The lateral meniscus is in satisfactory condition as are the cruciate and collateral ligaments. Both the films and report were reviewed by myself and discussed with the patient.   Impression: 1. Degenerative arthrosis right knee.  Plan:  The treatment options were discussed with the patient. In addition, patient educational materials were provided regarding the diagnosis and treatment options. The patient is quite frustrated by her symptoms and functional limitations, and is ready to consider more aggressive treatment options. Although the patient's MRI scan does demonstrate mild degenerative changes of the lateral and patellofemoral compartments, by my review, these changes are minimal. However, the patient does have symptoms pertaining both to the lateral and patellofemoral compartments on physical examination. Therefore, I have recommended that we proceed with a standard total knee arthroplasty rather than a partial knee replacement. The procedure was discussed with the patient, as were the potential risks (including bleeding, infection, nerve and/or blood vessel injury, persistent or recurrent pain, loosening and/or failure of the components, dislocation, need for further surgery, blood clots, strokes, heart attacks and/or arhythmias, pneumonia, etc.) and benefits. The patient states his/her understanding and wishes to proceed. All of the patient's questions and concerns were answered. She can call any time with further concerns. She will follow up post-surgery, routine.     H&P reviewed and patient re-examined. No changes.

## 2022-12-06 NOTE — Anesthesia Preprocedure Evaluation (Addendum)
Anesthesia Evaluation  Patient identified by MRN, date of birth, ID band Patient awake    Reviewed: Allergy & Precautions, NPO status , Patient's Chart, lab work & pertinent test results  History of Anesthesia Complications Negative for: history of anesthetic complications  Airway Mallampati: II  TM Distance: >3 FB Neck ROM: Full    Dental  (+) Edentulous Upper, Edentulous Lower   Pulmonary sleep apnea , neg COPD, Patient abstained from smoking.Not current smoker, former smoker Pulmonary sarcoidosis   Pulmonary exam normal breath sounds clear to auscultation       Cardiovascular Exercise Tolerance: Poor METShypertension, Pt. on medications (-) CAD and (-) Past MI (-) dysrhythmias  Rhythm:Regular Rate:Normal - Systolic murmurs H/o afib- s/p cardiac ablation 2005; no longer sees cardiology; no interventions for rate control or chronic anticoagulation therapy at this point   Neuro/Psych negative neurological ROS  negative psych ROS   GI/Hepatic ,neg GERD  ,,(+)     (-) substance abuse    Endo/Other  diabetes, Well Controlled, Type 2    Renal/GU negative Renal ROS     Musculoskeletal  (+) Arthritis ,    Abdominal  (+) + obese  Peds  Hematology   Anesthesia Other Findings Past Medical History: No date: Diabetes mellitus type 2, controlled ([place]) No date: History of kidney stones No date: Hyperlipidemia No date: Hypertension No date: Osteoarthritis of both knees No date: Pneumonia No date: Pulmonary sarcoidosis (HCC) No date: Sleep apnea No date: Vitamin D deficiency  Reproductive/Obstetrics                              Anesthesia Physical Anesthesia Plan  ASA: 3  Anesthesia Plan: Spinal   Post-op Pain Management: Regional block* and Ofirmev IV (intra-op)*   Induction: Intravenous  PONV Risk Score and Plan: 2 and Ondansetron, Dexamethasone, Propofol infusion, TIVA and Treatment  may vary due to age or medical condition  Airway Management Planned: Natural Airway  Additional Equipment: None  Intra-op Plan:   Post-operative Plan:   Informed Consent: I have reviewed the patients History and Physical, chart, labs and discussed the procedure including the risks, benefits and alternatives for the proposed anesthesia with the patient or authorized representative who has indicated his/her understanding and acceptance.       Plan Discussed with: CRNA and Surgeon  Anesthesia Plan Comments:          Anesthesia Quick Evaluation

## 2022-12-07 ENCOUNTER — Encounter: Payer: Self-pay | Admitting: Surgery

## 2022-12-07 DIAGNOSIS — M1711 Unilateral primary osteoarthritis, right knee: Secondary | ICD-10-CM | POA: Diagnosis not present

## 2022-12-07 LAB — GLUCOSE, CAPILLARY
Glucose-Capillary: 200 mg/dL — ABNORMAL HIGH (ref 70–99)
Glucose-Capillary: 202 mg/dL — ABNORMAL HIGH (ref 70–99)

## 2022-12-07 LAB — CBC
HCT: 39.1 % (ref 36.0–46.0)
Hemoglobin: 11.8 g/dL — ABNORMAL LOW (ref 12.0–15.0)
MCH: 22.2 pg — ABNORMAL LOW (ref 26.0–34.0)
MCHC: 30.2 g/dL (ref 30.0–36.0)
MCV: 73.5 fL — ABNORMAL LOW (ref 80.0–100.0)
Platelets: 346 10*3/uL (ref 150–400)
RBC: 5.32 MIL/uL — ABNORMAL HIGH (ref 3.87–5.11)
RDW: 14.5 % (ref 11.5–15.5)
WBC: 10.3 10*3/uL (ref 4.0–10.5)
nRBC: 0 % (ref 0.0–0.2)

## 2022-12-07 LAB — BASIC METABOLIC PANEL
Anion gap: 7 (ref 5–15)
BUN: 18 mg/dL (ref 8–23)
CO2: 24 mmol/L (ref 22–32)
Calcium: 8.9 mg/dL (ref 8.9–10.3)
Chloride: 104 mmol/L (ref 98–111)
Creatinine, Ser: 0.79 mg/dL (ref 0.44–1.00)
GFR, Estimated: >60 mL/min (ref >60)
Glucose, Bld: 216 mg/dL — ABNORMAL HIGH (ref 70–99)
Potassium: 4.6 mmol/L (ref 3.5–5.1)
Sodium: 135 mmol/L (ref 135–145)

## 2022-12-07 MED ORDER — OXYCODONE HCL 5 MG PO TABS: 5.0000 mg | ORAL_TABLET | ORAL | 0 refills | Status: DC | PRN

## 2022-12-07 MED ORDER — PANTOPRAZOLE SODIUM 40 MG PO TBEC
DELAYED_RELEASE_TABLET | ORAL | Status: AC
  Filled 2022-12-07: qty 1

## 2022-12-07 MED ORDER — METFORMIN HCL ER 500 MG PO TB24
ORAL_TABLET | ORAL | Status: AC
  Filled 2022-12-07: qty 1

## 2022-12-07 MED ORDER — ACETAMINOPHEN 500 MG PO TABS
ORAL_TABLET | ORAL | Status: AC
  Filled 2022-12-07: qty 2

## 2022-12-07 MED ORDER — DOCUSATE SODIUM 100 MG PO CAPS
ORAL_CAPSULE | ORAL | Status: AC
  Filled 2022-12-07: qty 1

## 2022-12-07 MED ORDER — OXYCODONE HCL 5 MG PO TABS
ORAL_TABLET | ORAL | Status: AC
  Filled 2022-12-07: qty 2

## 2022-12-07 MED ORDER — ACETAMINOPHEN 500 MG PO TABS: 1000.0000 mg | ORAL_TABLET | Freq: Four times a day (QID) | ORAL | 0 refills | Status: AC

## 2022-12-07 MED ORDER — APIXABAN 2.5 MG PO TABS: 2.5000 mg | ORAL_TABLET | Freq: Two times a day (BID) | ORAL | 0 refills | Status: DC

## 2022-12-07 MED ORDER — APIXABAN 2.5 MG PO TABS
ORAL_TABLET | ORAL | Status: AC
  Filled 2022-12-07: qty 1

## 2022-12-07 MED ORDER — CEFAZOLIN SODIUM-DEXTROSE 2-4 GM/100ML-% IV SOLN
INTRAVENOUS | Status: AC
  Filled 2022-12-07: qty 100

## 2022-12-07 MED ORDER — ONDANSETRON HCL 4 MG PO TABS: 4.0000 mg | ORAL_TABLET | Freq: Four times a day (QID) | ORAL | 0 refills | Status: DC | PRN

## 2022-12-07 MED ORDER — KETOROLAC TROMETHAMINE 15 MG/ML IJ SOLN
INTRAMUSCULAR | Status: AC
  Administered 2022-12-07: 7.5 mg via INTRAVENOUS
  Filled 2022-12-07: qty 1

## 2022-12-07 MED ORDER — INSULIN ASPART 100 UNIT/ML IJ SOLN
INTRAMUSCULAR | Status: AC
  Filled 2022-12-07: qty 1

## 2022-12-07 MED ORDER — GLIMEPIRIDE 1 MG PO TABS
ORAL_TABLET | ORAL | Status: AC
  Administered 2022-12-07: 4 mg via ORAL
  Filled 2022-12-07: qty 4

## 2022-12-07 MED ORDER — KETOROLAC TROMETHAMINE 15 MG/ML IJ SOLN
INTRAMUSCULAR | Status: AC
  Filled 2022-12-07: qty 1

## 2022-12-07 MED ORDER — LOSARTAN POTASSIUM 50 MG PO TABS
ORAL_TABLET | ORAL | Status: AC
  Administered 2022-12-07: 25 mg via ORAL
  Filled 2022-12-07: qty 1

## 2022-12-07 MED ORDER — VITAMIN D 25 MCG (1000 UNIT) PO TABS
ORAL_TABLET | ORAL | Status: AC
  Filled 2022-12-07: qty 1

## 2022-12-07 MED ORDER — METOPROLOL TARTRATE 25 MG PO TABS
ORAL_TABLET | ORAL | Status: AC
  Filled 2022-12-07: qty 1

## 2022-12-07 NOTE — Discharge Instructions (Signed)

## 2022-12-07 NOTE — TOC Progression Note (Signed)
Transition of Care [facility]) - Progression Note    Patient Details  Name: Judy Barber MRN: [id number] Date of Birth: 29-Aug-1956  Transition [facility]) CM/SW [place], RN Phone Number: 12/07/2022, 8:27 AM  Clinical Narrative:    The patient is not able to get [facility] due to Out of network with agency, the surgeons office has her sety u- as outpatient for PT, [employer] to deliver a 3 in 1 to the bedside, She has a RW at home   Expected Discharge Plan: OP Rehab Barriers to Discharge: No Barriers Identified  Expected Discharge Plan and Services   Discharge Planning Services: CM Consult   Living arrangements for the past 2 months: Single Family Home                 DME Arranged: 3-N-1 DME Agency: [employer] Date DME Agency Contacted: 12/07/22 Time DME Agency Contacted: [phone] Representative spoke with at DME Agency: intake HH Arranged: NA           Social Determinants of Health (SDOH) Interventions SDOH Screenings   Food Insecurity: No Food Insecurity (12/06/2022)  Housing: Low Risk  (12/06/2022)  Transportation Needs: No Transportation Needs (12/06/2022)  Utilities: Not At Risk (12/06/2022)  Tobacco Use: Medium Risk (12/06/2022)    Readmission Risk Interventions     No data to display

## 2022-12-07 NOTE — Progress Notes (Signed)
D/C pt to home under care of niece.  Pt stated she had walker at home and will get [facility] from thrift shop.

## 2022-12-07 NOTE — Progress Notes (Signed)
Physical Therapy Treatment Patient Details Name: Judy Barber MRN: [id number] DOB: 05-11-1956 Today's Date: 12/07/2022   History of Present Illness 67 y/o fermale s/p R TKA 2/27.    PT Comments    Pt showed improved mobility, confidence with ambulation/WBing, improved ROM and overall making good POD1 gains.  She did need some extra cuing and encouragement to remain on task but ultimately did all that was asked of her.  She is still having some R ankle DF weakness, with AROM to ~5*, reports has been swollen fot ~4 weeks, MD aware.  Pt was able to negotiate up/down steps simulating entry to the home w/o need for physical assist.   Recommendations for follow up therapy are one component of a multi-disciplinary discharge planning process, led by the attending physician.  Recommendations may be updated based on patient status, additional functional criteria and insurance authorization.  Follow Up Recommendations  Follow physician's recommendations for discharge plan and follow up therapies     Assistance Recommended at Discharge Frequent or constant Supervision/Assistance  Patient can return home with the following A little help with walking and/or transfers;A little help with bathing/dressing/bathroom;Assistance with cooking/housework;Assist for transportation   Equipment Recommendations  BSC/3in1    Recommendations for Other Services       Precautions / Restrictions Precautions Precautions: Fall Restrictions Weight Bearing Restrictions: Yes RLE Weight Bearing: Weight bearing as tolerated     Mobility  Bed Mobility Overal bed mobility: Modified Independent Bed Mobility: Supine to Sit     Supine to sit: Min guard     General bed mobility comments: Pt better able to control R LE getting to and off EOB    Transfers Overall transfer level: Modified independent Equipment used: Rolling walker (2 wheels) Transfers: Sit to/from Stand Sit to Stand: Min guard            General transfer comment: Still reliant on UEs and unable to get R LE totally under her, cues for positioning and to kick leg ouf for comfort on return to sitting    Ambulation/Gait Ambulation/Gait assistance: Supervision Gait Distance (Feet): 200 Feet Assistive device: Rolling walker (2 wheels)         General Gait Details: Pt with much improve cadence and confidence this session.  Complains of pain but able to maintain consistent momentum and showing good weight bearing tolerance with the effort; appropriate UE/walker use.   Stairs Stairs: Yes Stairs assistance: Min guard Stair Management: No rails, Backwards, Forwards, With walker Number of Stairs: 3 General stair comments: Pt showed ability to negotiate the step at her home w/o assist.  Good understanding and execution.   Wheelchair Mobility    Modified Rankin (Stroke Patients Only)       Balance Overall balance assessment: Needs assistance Sitting-balance support: No upper extremity supported Sitting balance-Leahy Scale: Good     Standing balance support: Bilateral upper extremity supported Standing balance-Leahy Scale: Good Standing balance comment: still tending to lean over onto walker but not nearly as reliant to [facility] or stability as she was yesterday.                            Cognition Arousal/Alertness: Awake/alert Behavior During Therapy: WFL for tasks assessed/performed Overall Cognitive Status: Within Functional Limits for tasks assessed  Exercises Total Joint Exercises Ankle Circles/Pumps: AROM, 10 reps (R ankle DF weak (3-/5), AROM to ~5*) Quad Sets: Strengthening, 10 reps Heel Slides: AROM, 10 reps Hip ABduction/ADduction: Strengthening, 10 reps Straight Leg Raises: AROM, 10 reps Long Arc Quad: AROM, 10 reps Knee Flexion: PROM, 5 reps Goniometric ROM: 1-80    General Comments        Pertinent Vitals/Pain Pain  Assessment Pain Assessment: 0-10 Pain Score: 7  Pain Location: R knee    Home Living                          Prior Function            PT Goals (current goals can now be found in the care plan section) Progress towards PT goals: Progressing toward goals    Frequency    BID      PT Plan      Co-evaluation              AM-PAC PT "6 Clicks" Mobility   Outcome Measure  Help needed turning from your back to your side while in a flat bed without using bedrails?: A Little Help needed moving from lying on your back to sitting on the side of a flat bed without using bedrails?: A Little Help needed moving to and from a bed to a chair (including a wheelchair)?: A Little Help needed standing up from a chair using your arms (e.g., wheelchair or bedside chair)?: A Little Help needed to walk in hospital room?: A Little Help needed climbing 3-5 steps with a railing? : A Little 6 Click Score: 18    End of Session Equipment Utilized During Treatment: Gait belt Activity Tolerance: Patient tolerated treatment well Patient left: with call bell/phone within reach;in chair Nurse Communication: Mobility status PT Visit Diagnosis: Muscle weakness (generalized) (M62.81);Difficulty in walking, not elsewhere classified (R26.2);Pain Pain - Right/Left: Right Pain - part of body: Knee     Time: [id number] PT Time Calculation (min) (ACUTE ONLY): 40 min  Charges:  $Gait Training: 8-22 mins $Therapeutic Exercise: 8-22 mins $Therapeutic Activity: 8-22 mins                     Kreg Shropshire, DPT 12/07/2022, 11:16 AM

## 2022-12-07 NOTE — Progress Notes (Signed)
Patient is not able to walk the distance required to go the bathroom, or he/she is unable to safely negotiate stairs required to access the bathroom.  A 3in1 BSC will alleviate this problem  

## 2022-12-07 NOTE — Progress Notes (Signed)
  Subjective: 1 Day Post-Op Procedure(s) (LRB): TOTAL KNEE ARTHROPLASTY (Right) Patient reports pain as mild.   Patient is well, and has had no acute complaints or problems Plan is to go Home after hospital stay. Negative for chest pain and shortness of breath Fever: no Gastrointestinal:Negative for nausea and vomiting She reports she is passing some gas this morning.  Objective: Vital signs in last 24 hours: Temp:  [97.6 F (36.4 C)-99.1 F (37.3 C)] 97.7 F (36.5 C) (02/28 0710) Pulse Rate:  [71-126] 71 (02/28 0710) Resp:  [14-22] 15 (02/28 0710) BP: (136-176)/(56-114) 141/64 (02/28 0710) SpO2:  [91 %-100 %] 96 % (02/28 0710) Weight:  [93.4 kg] 93.4 kg (02/27 0939)  Intake/Output from previous day:  Intake/Output Summary (Last 24 hours) at 12/07/2022 0837 Last data filed at 12/07/2022 0306 Gross per 24 hour  Intake 1917.3 ml  Output 15 ml  Net 1902.3 ml    Intake/Output this shift: No intake/output data recorded.  Labs: Recent Labs    12/07/22 0540  HGB 11.8*   Recent Labs    12/07/22 0540  WBC 10.3  RBC 5.32*  HCT 39.1  PLT 346   Recent Labs    12/07/22 0540  NA 135  K 4.6  CL 104  CO2 24  BUN 18  CREATININE 0.79  GLUCOSE 216*  CALCIUM 8.9   No results for input(s): "LABPT", "INR" in the last 72 hours.   EXAM General - Patient is Alert, Appropriate, and Oriented Extremity - ABD soft Sensation intact distally Intact pulses distally Incision: scant drainage No cellulitis present Compartment soft Patient is intact to light touch to the right leg but does have limited dorsiflexion of the right ankle at this time.  She is able to flex and extend toes. Dressing/Incision - blood tinged drainage noted to the right knee Motor Function - intact, moving foot and toes well on exam.  Abdomen soft this morning with intact bowel sounds.  Past Medical History:  Diagnosis Date   Diabetes mellitus type 2, controlled ([place])    Heart murmur    History of  atrial fibrillation    a.) s/p cardiac ablation 2005; no longer sees cardiology; no interventions for rate control or chronic anticoagulation therapy at this point   History of kidney stones    Hyperlipidemia    Hypertension    Osteoarthritis of both knees    Pneumonia    Pulmonary sarcoidosis (HCC)    Sleep apnea    has never received cpap   Vitamin D deficiency     Assessment/Plan: 1 Day Post-Op Procedure(s) (LRB): TOTAL KNEE ARTHROPLASTY (Right) Principal Problem:   Status post total knee replacement using cement, right  Estimated body mass index is 36.48 kg/m as calculated from the following:   Height as of this encounter: 5' 3"$  (1.6 m).   Weight as of this encounter: 93.4 kg. Advance diet Up with therapy D/C IV fluids when tolerating po intake.  Labs and vitals reviewed this AM, Hg 11.8 this morning. Patient with limited dorsiflexion of the right ankle, likely from numbing medication injected at the time of surgery.  Expect to improve throughout the day, continue to monitor. Continue to work on BM, patient is passing gas without pain. Up with therapy today, plan for discharge home today pending progress with PT.  DVT Prophylaxis - TED hose and Eliquis and SCDs Weight-Bearing as tolerated to right leg  J. Cameron Proud, PA-C [facility] 12/07/2022, 8:37 AM

## 2022-12-07 NOTE — Discharge Summary (Signed)
Physician Discharge Summary  Patient ID: Judy Barber MRN: [id number] DOB/AGE: [birth year] 67 y.o.  Admit date: 12/06/2022 Discharge date: 12/07/2022  Admission Diagnoses:  Status post total knee replacement using cement, right [Z96.651] Right knee degenerative joint disease  Discharge Diagnoses: Patient Active Problem List   Diagnosis Date Noted   Status post total knee replacement using cement, right 12/06/2022   Status post left partial knee replacement 02/17/2022    Past Medical History:  Diagnosis Date   Diabetes mellitus type 2, controlled ([place])    Heart murmur    History of atrial fibrillation    a.) s/p cardiac ablation 2005; no longer sees cardiology; no interventions for rate control or chronic anticoagulation therapy at this point   History of kidney stones    Hyperlipidemia    Hypertension    Osteoarthritis of both knees    Pneumonia    Pulmonary sarcoidosis ([place])    Sleep apnea    has never received cpap   Vitamin D deficiency      Transfusion: None.   Consultants (if any):   Discharged Condition: Improved  Hospital Course: Judy Barber is an 67 y.o. female who was admitted 12/06/2022 with a diagnosis of right knee degenerative joint disease and went to the operating room on 12/06/2022 and underwent the above named procedures.   Patient with initial weakness with dorsiflexion of the right foot, improved throughout POD1.   Surgeries: Procedure(s): TOTAL KNEE ARTHROPLASTY on 12/06/2022 Patient tolerated the surgery well. Taken to PACU where she was stabilized and then transferred to the orthopedic floor.  Started on Eliquis 2.'5mg'$  every 12 hrs. Heels elevated on bed with rolled towels. No evidence of DVT. Negative [place]. Physical therapy started on day #1 for gait training and transfer. OT started day #1 for ADL and assisted devices.  Patient's IV , foley and hemovac was d/c on day #2.  Implants: Right TKA using all-cemented [employer] Vanguard system with a  67.5 mm mm PCR femur, a 71 mm tibial tray with a 14 mm anterior stabilized E-poly insert, and a 34 x 7.8 mm all-poly 3-pegged domed patella.   She was given perioperative antibiotics:  Anti-infectives (From admission, onward)    Start     Dose/Rate Route Frequency Ordered Stop   12/07/22 0142  ceFAZolin (ANCEF) 2-4 GM/100ML-% IVPB       Note to Pharmacy: [place] [facility], [place]ryst: cabinet override      12/07/22 0142 12/07/22 0305   12/06/22 1855  ceFAZolin (ANCEF) 2-4 GM/100ML-% IVPB       Note to Pharmacy: Latrelle Dodrill D: cabinet override      12/06/22 1855 12/06/22 2016   12/06/22 1700  ceFAZolin (ANCEF) IVPB 2g/100 mL premix        2 g 200 mL/hr over 30 Minutes Intravenous Every 6 hours 12/06/22 1509 12/07/22 0759   12/06/22 0936  ceFAZolin (ANCEF) 2-4 GM/100ML-% IVPB       Note to Pharmacy: [facility] C: cabinet override      12/06/22 0936 12/06/22 2016   12/06/22 0600  ceFAZolin (ANCEF) IVPB 2g/100 mL premix        2 g 200 mL/hr over 30 Minutes Intravenous On call to O.R. 12/05/22 2215 12/06/22 1140     .  She was given sequential compression devices, early ambulation, and Eliquis for DVT prophylaxis.  She benefited maximally from the hospital stay and there were no complications.    Recent vital signs:  Vitals:   12/07/22 0148 12/07/22  0710  BP: (!) 160/82 (!) 141/64  Pulse: 90 71  Resp: 16 15  Temp: 98.4 F (36.9 C) 97.7 F (36.5 C)  SpO2: 98% 96%    Recent laboratory studies:  Lab Results  Component Value Date   HGB 11.8 (L) 12/07/2022   HGB 12.1 11/28/2022   HGB 12.7 02/04/2022   Lab Results  Component Value Date   WBC 10.3 12/07/2022   PLT 346 12/07/2022   No results found for: "INR" Lab Results  Component Value Date   NA 135 12/07/2022   K 4.6 12/07/2022   CL 104 12/07/2022   CO2 24 12/07/2022   BUN 18 12/07/2022   CREATININE 0.79 12/07/2022   GLUCOSE 216 (H) 12/07/2022    Discharge Medications:   Allergies as of 12/07/2022       Reactions    Bee Venom Swelling   Wasp Venom Swelling   Yellow Jacket Venom Swelling        Medication List     STOP taking these medications    meloxicam 15 MG tablet Commonly known as: MOBIC       TAKE these medications    acetaminophen 500 MG tablet Commonly known as: TYLENOL Take 2 tablets (1,000 mg total) by mouth every 6 (six) hours.   alendronate 70 MG tablet Commonly known as: FOSAMAX Take 70 mg by mouth once a week. Take with a full glass of water on an empty stomach. 'Sundays   apixaban 2.5 MG Tabs tablet Commonly known as: ELIQUIS Take 1 tablet (2.5 mg total) by mouth 2 (two) times daily.   aspirin EC 81 MG tablet Take 81 mg by mouth daily. Swallow whole.   cholecalciferol 25 MCG (1000 UNIT) tablet Commonly known as: VITAMIN D3 Take 1,000 Units by mouth daily.   glimepiride 4 MG tablet Commonly known as: AMARYL Take 4 mg by mouth in the morning and at bedtime.   losartan 25 MG tablet Commonly known as: COZAAR Take 25 mg by mouth every morning.   metFORMIN 500 MG 24 hr tablet Commonly known as: GLUCOPHAGE-XR Take 500 mg by mouth 2 (two) times daily.   ondansetron 4 MG tablet Commonly known as: ZOFRAN Take 1 tablet (4 mg total) by mouth every 6 (six) hours as needed for nausea.   oxyCODONE 5 MG immediate release tablet Commonly known as: Oxy IR/ROXICODONE Take 1-2 tablets (5-10 mg total) by mouth every 4 (four) hours as needed for moderate pain (pain score 4-6).   pantoprazole 40 MG tablet Commonly known as: PROTONIX Take 1 tablet by mouth once daily   simvastatin 20 MG tablet Commonly known as: ZOCOR Take 20 mg by mouth every morning.               Durable Medical Equipment  (From admission, onward)           Start     Ordered   12/06/22 1510  DME Bedside commode  Once       Question:  Patient needs a bedside commode to treat with the following condition  Answer:  Status post total knee replacement using cement, right   12/06/22  1509   12/06/22 1510  DME 3 n 1  Once        02'$ /27/24 1509   12/06/22 1510  DME Walker rolling  Once       Question Answer Comment  Walker: With 5 Inch Wheels   Patient needs a walker to treat with the following condition Status post  total knee replacement using cement, right      12/06/22 1509            Diagnostic Studies: DG Knee Right Port  Result Date: 12/06/2022 CLINICAL DATA:  Postoperative day 0 for total knee replacement EXAM: PORTABLE RIGHT KNEE - 1-2 VIEW COMPARISON:  09/20/2022 FINDINGS: Total knee prosthesis in place with expected alignment of the various components and no findings of periprosthetic fracture or early complicating feature. Typical amount of postoperative gas in the joint. Anterior skin staples. Mild patellar spurring along the quadriceps and patellar tendon attachment sites. IMPRESSION: 1. Right total knee prosthesis without complicating feature. Electronically Signed   By: Van Clines M.D.   On: 12/06/2022 14:23    Disposition: Plan for discharge home today pending progress with therapy.   Follow-up Information     Lattie Corns, PA-C Follow up in 14 day(s).   Specialty: Physician Assistant Why: Electa Sniff information: [place] [address] [phone]                Signed: Judson Roch PA-C 12/07/2022, 10:11 AM

## 2022-12-16 ENCOUNTER — Ambulatory Visit: Payer: Self-pay | Admitting: Internal Medicine

## 2022-12-18 ENCOUNTER — Emergency Department
Admission: EM | Admit: 2022-12-18 | Discharge: 2022-12-18 | Disposition: A | Discharge status: Home / Self Care | Payer: 59 | Attending: Emergency Medicine | Admitting: Emergency Medicine

## 2022-12-18 ENCOUNTER — Other Ambulatory Visit: Payer: Self-pay

## 2022-12-18 ENCOUNTER — Emergency Department: Payer: 59

## 2022-12-18 ENCOUNTER — Encounter: Payer: Self-pay | Admitting: Emergency Medicine

## 2022-12-18 DIAGNOSIS — M79651 Pain in right thigh: Secondary | ICD-10-CM | POA: Diagnosis present

## 2022-12-18 DIAGNOSIS — E119 Type 2 diabetes mellitus without complications: Secondary | ICD-10-CM | POA: Diagnosis not present

## 2022-12-18 DIAGNOSIS — I10 Essential (primary) hypertension: Secondary | ICD-10-CM | POA: Insufficient documentation

## 2022-12-18 DIAGNOSIS — Z96651 Presence of right artificial knee joint: Secondary | ICD-10-CM | POA: Insufficient documentation

## 2022-12-18 DIAGNOSIS — M79604 Pain in right leg: Secondary | ICD-10-CM

## 2022-12-18 NOTE — ED Notes (Signed)
Provided crackers and juice per request, ok'd by EDP. Ultrasound at bedside.

## 2022-12-18 NOTE — ED Triage Notes (Signed)
Pt via POV from home. Pt c/o R leg pain and swelling to the R thigh. Pt had a R knee replacement here on 2/27, states she was unable to afford the blood thinner and was instructed to take 4 baby ASA daily which has been doing. States she started having the R leg pain a week ago. Pt is A&Ox4 and NAD. Ambulatory with walker.

## 2022-12-18 NOTE — Discharge Instructions (Signed)
Your ultrasound does not show any blood clots in your leg today.  Continue taking your medications, using your compression stocking, and following up with PT as usual.

## 2022-12-18 NOTE — ED Notes (Signed)
Examined pt with EDP. Pt complains of anterior and lateral R thigh pain, sharp, since yesterday. Denies calf pain. Surgical site is wnl, no redness or swelling or drainage noted. R knee surgery was 2 weeks ago. States RLE has always been more swollen than L and that this has improved since the surgery.

## 2022-12-18 NOTE — ED Provider Notes (Signed)
   [facility] Provider Note    Event Date/Time   First MD Initiated Contact with Patient 12/18/22 [phone]     (approximate)   History   Chief Complaint: Leg Pain   HPI  Judy Barber is a 67 y.o. female with a history of diabetes, hyperlipidemia, hypertension, recent right total knee replacement on February 27 who comes ED complaining of right thigh pain.  No chest pain or shortness of breath, no fever.  No significant swelling.  No falls or trauma since surgery.  Patient has been doing PT     Physical Exam   Triage Vital Signs: ED Triage Vitals  Enc Vitals Group     BP 12/18/22 0931 (!) 182/62     Pulse Rate 12/18/22 0931 (!) 102     Resp 12/18/22 0931 18     Temp 12/18/22 0931 98.1 F (36.7 C)     Temp Source 12/18/22 0931 Oral     SpO2 12/18/22 0931 97 %     Weight 12/18/22 0931 209 lb (94.8 kg)     Height 12/18/22 0931 5\' 3"  (1.6 m)     Head Circumference --      Peak Flow --      Pain Score 12/18/22 0936 7     Pain Loc --      Pain Edu? --      Excl. in [place]? --     Most recent vital signs: Vitals:   12/18/22 0931  BP: (!) 182/62  Pulse: (!) 102  Resp: 18  Temp: 98.1 F (36.7 C)  SpO2: 97%    General: Awake, no distress.  CV:  Good peripheral perfusion.  Normal DP pulse bilaterally Resp:  Normal effort.  Abd:  No distention.  Other:  Lower extremities soft tissues are soft, no bony deformity or point tenderness.  There is tenderness in the mid thigh anterolaterally on the right leg.  Calf circumference is equal.  No erythema.   ED Results / Procedures / Treatments   Labs (all labs ordered are listed, but only abnormal results are displayed) Labs Reviewed - No data to display   EKG    RADIOLOGY Ultrasound right lower extremity interpreted by me, negative for DVT.  Radiology report reviewed.   PROCEDURES:  Procedures   MEDICATIONS ORDERED IN ED: Medications - No data to display   IMPRESSION / MDM / [place] / ED COURSE  I reviewed the triage vital signs and the nursing notes.  DDx: DVT, muscle strain/spasm.  Doubt PE, infection, fracture  Patient's presentation is most consistent with acute presentation with potential threat to life or bodily function.  Patient presents with right thigh pain in the setting of TKR about 2 weeks ago.  Exam is not consistent with DVT but with recent surgery will obtain ultrasound.  Doubt PE or infection.  No evidence of fracture.   ----------------------------------------- 11:11 AM on 12/18/2022 ----------------------------------------- Ultrasound negative.  Stable for discharge      FINAL CLINICAL IMPRESSION(S) / ED DIAGNOSES   Final diagnoses:  Right leg pain     Rx / DC Orders   ED Discharge Orders     None        Note:  This document was prepared using Dragon voice recognition software and may include unintentional dictation errors.   Carrie Mew, MD 12/18/22 [phone]

## 2022-12-30 ENCOUNTER — Encounter: Payer: Self-pay | Admitting: Internal Medicine

## 2022-12-30 ENCOUNTER — Ambulatory Visit: Payer: 59 | Admitting: Internal Medicine

## 2022-12-30 VITALS — BP 138/84 | HR 106 | Ht 63.0 in | Wt 201.6 lb

## 2022-12-30 DIAGNOSIS — I10 Essential (primary) hypertension: Secondary | ICD-10-CM

## 2022-12-30 DIAGNOSIS — E1165 Type 2 diabetes mellitus with hyperglycemia: Secondary | ICD-10-CM

## 2022-12-30 DIAGNOSIS — M109 Gout, unspecified: Secondary | ICD-10-CM | POA: Insufficient documentation

## 2022-12-30 DIAGNOSIS — M1A4710 Other secondary chronic gout, right ankle and foot, without tophus (tophi): Secondary | ICD-10-CM

## 2022-12-30 DIAGNOSIS — H509 Unspecified strabismus: Secondary | ICD-10-CM | POA: Insufficient documentation

## 2022-12-30 DIAGNOSIS — M25473 Effusion, unspecified ankle: Secondary | ICD-10-CM | POA: Diagnosis not present

## 2022-12-30 DIAGNOSIS — M79671 Pain in right foot: Secondary | ICD-10-CM | POA: Diagnosis not present

## 2022-12-30 DIAGNOSIS — E782 Mixed hyperlipidemia: Secondary | ICD-10-CM

## 2022-12-30 LAB — POCT CBG (FASTING - GLUCOSE)-MANUAL ENTRY: Glucose Fasting, POC: 299 mg/dL — AB (ref 70–99)

## 2022-12-30 MED ORDER — COLCHICINE 0.6 MG PO TABS: 0.6000 mg | ORAL_TABLET | Freq: Two times a day (BID) | ORAL | 2 refills | Status: AC

## 2022-12-30 MED ORDER — FUROSEMIDE 20 MG PO TABS: 20.0000 mg | ORAL_TABLET | Freq: Every day | ORAL | 1 refills | Status: DC

## 2022-12-30 MED ORDER — MELOXICAM 15 MG PO TABS: 15.0000 mg | ORAL_TABLET | Freq: Every day | ORAL | 0 refills | Status: AC

## 2022-12-30 NOTE — Progress Notes (Signed)
Established Patient Office Visit  Subjective:  Patient ID: Judy Barber, female    DOB: November 07, 1955  Age: 67 y.o. MRN: [id number]  Chief Complaint  Patient presents with   Follow-up    Right foot swelling    Patient comes in with complaints of swelling and pain in her right foot.  Patient recently had total right knee replacement on December 06, 2021.  Procedure was successful and patient has been undergoing physical therapy.  She had 1 episode of right leg pain and went to the emergency room on 10 March.  Dopplers were done and it was negative for DVT.  However this is pain and swelling of her right foot is new.  Patient mentions that in January of this year she had something similar and she went to an urgent care where she was told that her x-rays were negative but this could be an acute gouty attack.  She was given a prescription for colchicine which she took for a few days. Today there is significant swelling of the entire foot/ pitting edema all the way to her ankle.  There is no bruising, no redness and no rash on her right foot. Patient denies any chest pain ,cough,or shortness of breath.  There is no fever and no chills.  Of note is that patient is more active and has her legs down more than usual.  She is currently wearing compression stockings. Will check her blood work today.  Add a small dose furosemide and patient advised to keep her legs elevated whenever she sits down.  And also to continue wearing her compression stockings    No other concerns at this time.   Past Medical History:  Diagnosis Date   Diabetes mellitus type 2, controlled ([place])    Heart murmur    History of atrial fibrillation    a.) s/p cardiac ablation 2005; no longer sees cardiology; no interventions for rate control or chronic anticoagulation therapy at this point   History of kidney stones    Hyperlipidemia    Hypertension    Osteoarthritis of both knees    Pneumonia    Pulmonary sarcoidosis ([place])     Sleep apnea    has never received cpap   Vitamin D deficiency     Past Surgical History:  Procedure Laterality Date   APPENDECTOMY  1980   BREAST BIOPSY Left 2017   benign   BREAST EXCISIONAL BIOPSY Right 2013   benign   CARDIAC ELECTROPHYSIOLOGY [place] AND ABLATION  2005   COLONOSCOPY  2019   KNEE ARTHROSCOPY W/ MENISCECTOMY Right 03/01/2017   LUNG BIOPSY  2005   sarcoidosis   PARTIAL KNEE ARTHROPLASTY Left 02/17/2022   Procedure: UNICOMPARTMENTAL KNEE;  Surgeon: Corky Mull, MD;  Location: [facility] ORS;  Service: Orthopedics;  Laterality: Left;   STRABISMUS SURGERY Bilateral    as a child   TOTAL KNEE ARTHROPLASTY Right 12/06/2022   Procedure: TOTAL KNEE ARTHROPLASTY;  Surgeon: Corky Mull, MD;  Location: [facility] ORS;  Service: Orthopedics;  Laterality: Right;   TUBAL LIGATION      Social History   Socioeconomic History   Marital status: Divorced    Spouse name: Not on file   Number of children: 0   Years of education: Not on file   Highest education level: Not on file  Occupational History   Not on file  Tobacco Use   Smoking status: Former    Packs/day: 0.50    Years: 25.00  Additional pack years: 0.00    Total pack years: 12.50    Types: Cigarettes    Quit date: 2005    Years since quitting: 19.2   Smokeless tobacco: Never  Vaping Use   Vaping Use: Never used  Substance and Sexual Activity   Alcohol use: Not Currently   Drug use: Never   Sexual activity: Not on file  Other Topics Concern   Not on file  Social History Narrative   Lives with niece   Social Determinants of Health   Financial Resource Strain: Not on file  Food Insecurity: No Food Insecurity (12/06/2022)   Hunger Vital Sign    Worried About Running Out of Food in the Last Year: Never true    Ran Out of Food in the Last Year: Never true  Transportation Needs: No Transportation Needs (12/06/2022)   PRAPARE - Hydrologist (Medical): No    Lack of Transportation  (Non-Medical): No  Physical Activity: Not on file  Stress: Not on file  Social Connections: Not on file  Intimate Partner Violence: Not At Risk (12/06/2022)   Humiliation, Afraid, Rape, and Kick questionnaire    Fear of Current or Ex-Partner: No    Emotionally Abused: No    Physically Abused: No    Sexually Abused: No    Family History  Problem Relation Age of Onset   Heart attack Mother    Stroke Father    Breast cancer Cousin     Allergies  Allergen Reactions   Bee Venom Swelling   Wasp Venom Swelling   Yellow Jacket Venom Swelling    Review of Systems  Constitutional:  Negative for chills, fever, malaise/fatigue and weight loss.  HENT: Negative.    Eyes: Negative.   Respiratory:  Negative for cough, shortness of breath and wheezing.   Cardiovascular:  Negative for chest pain, orthopnea and claudication.  Gastrointestinal:  Negative for abdominal pain, blood in stool, constipation, diarrhea, heartburn, nausea and vomiting.  Genitourinary:  Negative for dysuria, flank pain, frequency, hematuria and urgency.  Musculoskeletal:  Negative for myalgias and neck pain.  Skin:  Negative for itching and rash.  Neurological:  Negative for dizziness, tingling, tremors, weakness and headaches.  Psychiatric/Behavioral: Negative.         Objective:   BP 138/84   Pulse (!) 106   Ht 5\' 3"  (1.6 m)   Wt 201 lb 9.6 oz (91.4 kg)   SpO2 97%   BMI 35.71 kg/m   Vitals:   12/30/22 1045  BP: 138/84  Pulse: (!) 106  Height: 5\' 3"  (1.6 m)  Weight: 201 lb 9.6 oz (91.4 kg)  SpO2: 97%  BMI (Calculated): 35.72    Physical Exam Vitals and nursing note reviewed.  Constitutional:      Appearance: She is obese.  Cardiovascular:     Rate and Rhythm: Normal rate and regular rhythm.  Pulmonary:     Effort: Pulmonary effort is normal.     Breath sounds: Normal breath sounds.  Abdominal:     General: Abdomen is flat.     Palpations: Abdomen is soft.  Musculoskeletal:         General: Normal range of motion.     Cervical back: Normal range of motion and neck supple.  Neurological:     General: No focal deficit present.     Mental Status: She is alert and oriented to person, place, and time.      Results for orders placed  or performed in visit on 12/30/22  POCT CBG (Fasting - Glucose)  Result Value Ref Range   Glucose Fasting, POC 299 (A) 70 - 99 mg/dL        Assessment & Plan:  Start furosemide 20 mg once a day for the next 6 days.  Check blood work today.  Can continue meloxicam and colchicine. Problem List Items Addressed This Visit     Essential hypertension, benign   Relevant Medications   furosemide (LASIX) 20 MG tablet   Other Relevant Orders   CBC With Differential   CMP14+EGFR   Type 2 diabetes mellitus with hyperglycemia, without long-term current use of insulin (HCC)   Relevant Orders   POCT CBG (Fasting - Glucose) (Completed)   Hemoglobin A1c   Right foot pain - Primary   Relevant Medications   colchicine 0.6 MG tablet   meloxicam (MOBIC) 15 MG tablet   Other Relevant Orders   CBC With Differential   Uric acid   Ankle edema   Relevant Medications   furosemide (LASIX) 20 MG tablet   Gout   Relevant Orders   CBC With Differential   Uric acid   Mixed hyperlipidemia   Relevant Medications   furosemide (LASIX) 20 MG tablet   Other Relevant Orders   Lipid Panel w/o Chol/HDL Ratio    Return in about 6 days (around 01/05/2023).   Total time spent: 30 minutes  Perrin Maltese, MD  12/30/2022

## 2022-12-31 LAB — CMP14+EGFR
ALT: 19 IU/L (ref 0–32)
AST: 22 IU/L (ref 0–40)
Albumin/Globulin Ratio: 1.3 (ref 1.2–2.2)
Albumin: 3.9 g/dL (ref 3.9–4.9)
Alkaline Phosphatase: 130 IU/L — ABNORMAL HIGH (ref 44–121)
BUN/Creatinine Ratio: 12 (ref 12–28)
BUN: 9 mg/dL (ref 8–27)
Bilirubin Total: 0.3 mg/dL (ref 0.0–1.2)
CO2: 25 mmol/L (ref 20–29)
Calcium: 10.3 mg/dL (ref 8.7–10.3)
Chloride: 106 mmol/L (ref 96–106)
Creatinine, Ser: 0.78 mg/dL (ref 0.57–1.00)
Globulin, Total: 2.9 g/dL (ref 1.5–4.5)
Glucose: 224 mg/dL — ABNORMAL HIGH (ref 70–99)
Potassium: 4.5 mmol/L (ref 3.5–5.2)
Sodium: 144 mmol/L (ref 134–144)
Total Protein: 6.8 g/dL (ref 6.0–8.5)
eGFR: 84 mL/min/{1.73_m2} (ref >59)

## 2022-12-31 LAB — CBC WITH DIFFERENTIAL
Basophils Absolute: 0 10*3/uL (ref 0.0–0.2)
Basos: 0 %
EOS (ABSOLUTE): 0.1 10*3/uL (ref 0.0–0.4)
Eos: 1 %
Hematocrit: 39.9 % (ref 34.0–46.6)
Hemoglobin: 12.4 g/dL (ref 11.1–15.9)
Immature Grans (Abs): 0 10*3/uL (ref 0.0–0.1)
Immature Granulocytes: 0 %
Lymphocytes Absolute: 1 10*3/uL (ref 0.7–3.1)
Lymphs: 20 %
MCH: 23.1 pg — ABNORMAL LOW (ref 26.6–33.0)
MCHC: 31.1 g/dL — ABNORMAL LOW (ref 31.5–35.7)
MCV: 74 fL — ABNORMAL LOW (ref 79–97)
Monocytes Absolute: 0.3 10*3/uL (ref 0.1–0.9)
Monocytes: 7 %
Neutrophils Absolute: 3.6 10*3/uL (ref 1.4–7.0)
Neutrophils: 72 %
RBC: 5.36 x10E6/uL — ABNORMAL HIGH (ref 3.77–5.28)
RDW: 13.8 % (ref 11.7–15.4)
WBC: 5 10*3/uL (ref 3.4–10.8)

## 2022-12-31 LAB — URIC ACID: Uric Acid: 4 mg/dL (ref 3.0–7.2)

## 2022-12-31 LAB — LIPID PANEL W/O CHOL/HDL RATIO
Cholesterol, Total: 138 mg/dL (ref 100–199)
HDL: 50 mg/dL (ref >39)
LDL Chol Calc (NIH): 68 mg/dL (ref 0–99)
Triglycerides: 107 mg/dL (ref 0–149)
VLDL Cholesterol Cal: 20 mg/dL (ref 5–40)

## 2022-12-31 LAB — HEMOGLOBIN A1C
Est. average glucose Bld gHb Est-mCnc: 186 mg/dL
Hgb A1c MFr Bld: 8.1 % — ABNORMAL HIGH (ref 4.8–5.6)

## 2023-01-05 ENCOUNTER — Encounter: Payer: Self-pay | Admitting: Internal Medicine

## 2023-01-05 ENCOUNTER — Ambulatory Visit (INDEPENDENT_AMBULATORY_CARE_PROVIDER_SITE_OTHER): Payer: 59 | Admitting: Internal Medicine

## 2023-01-05 VITALS — BP 132/70 | HR 95 | Ht 63.0 in | Wt 204.0 lb

## 2023-01-05 DIAGNOSIS — M25473 Effusion, unspecified ankle: Secondary | ICD-10-CM | POA: Diagnosis not present

## 2023-01-05 DIAGNOSIS — E1165 Type 2 diabetes mellitus with hyperglycemia: Secondary | ICD-10-CM | POA: Diagnosis not present

## 2023-01-05 DIAGNOSIS — E782 Mixed hyperlipidemia: Secondary | ICD-10-CM

## 2023-01-05 DIAGNOSIS — I10 Essential (primary) hypertension: Secondary | ICD-10-CM

## 2023-01-05 LAB — POCT CBG (FASTING - GLUCOSE)-MANUAL ENTRY: Glucose Fasting, POC: 207 mg/dL — AB (ref 70–99)

## 2023-01-05 NOTE — Progress Notes (Signed)
Established Patient Office Visit  Subjective:  Patient ID: Judy Barber, female    DOB: 1956/02/11  Age: 67 y.o. MRN: [id number]  Chief Complaint  Patient presents with   Follow-up    1 week follow up    Patient comes in for her follow-up of right foot and ankle swelling and pain.  Patient was once told that she may have had gout so she was taking colchicine but her uric acid levels are low.  She was also started on p.o. Lasix which now she will take on Monday Wednesdays and Fridays only.  The swelling has improved although she still has some discomfort.  Rest of her blood work was also discussed today.  Her hemoglobin A1c is 8.1.  Patient admits to dietary noncompliance and lack of physical activity since she had her right total knee replacement.  She understands the need to improve her diet control from now 1. Patient said that she did not pick up [place] prescription  as it was not covered initially by the insurance.  However she will try to get it now.  Meanwhile she will increase her metformin to 750 mg twice a day.  Patient has no other complaints    No other concerns at this time.   Past Medical History:  Diagnosis Date   Diabetes mellitus type 2, controlled ([place])    Heart murmur    History of atrial fibrillation    a.) s/p cardiac ablation 2005; no longer sees cardiology; no interventions for rate control or chronic anticoagulation therapy at this point   History of kidney stones    Hyperlipidemia    Hypertension    Osteoarthritis of both knees    Pneumonia    Pulmonary sarcoidosis ([place])    Sleep apnea    has never received cpap   Vitamin D deficiency     Past Surgical History:  Procedure Laterality Date   APPENDECTOMY  1980   BREAST BIOPSY Left 2017   benign   BREAST EXCISIONAL BIOPSY Right 2013   benign   CARDIAC ELECTROPHYSIOLOGY [place] AND ABLATION  2005   COLONOSCOPY  2019   KNEE ARTHROSCOPY W/ MENISCECTOMY Right 03/01/2017   LUNG BIOPSY  2005    sarcoidosis   PARTIAL KNEE ARTHROPLASTY Left 02/17/2022   Procedure: UNICOMPARTMENTAL KNEE;  Surgeon: Corky Mull, MD;  Location: [facility] ORS;  Service: Orthopedics;  Laterality: Left;   STRABISMUS SURGERY Bilateral    as a child   TOTAL KNEE ARTHROPLASTY Right 12/06/2022   Procedure: TOTAL KNEE ARTHROPLASTY;  Surgeon: Corky Mull, MD;  Location: [facility] OR[facility];  Service: Orthopedics;  Laterality: Right;   TUBAL LIGATION      Social History   Socioeconomic History   Marital status: Divorced    Spouse name: Not on file   Number of children: 0   Years of education: Not on file   Highest education level: Not on file  Occupational History   Not on file  Tobacco Use   Smoking status: Former    Packs/day: 0.50    Years: 25.00    Additional pack years: 0.00    Total pack years: 12.50    Types: Cigarettes    Quit date: 2005    Years since quitting: 19.2   Smokeless tobacco: Never  Vaping Use   Vaping Use: Never used  Substance and Sexual Activity   Alcohol use: Not Currently   Drug use: Never   Sexual activity: Not on file  Other Topics  Concern   Not on file  Social History Narrative   Lives with niece   Social Determinants of Health   Financial Resource Strain: Not on file  Food Insecurity: No Food Insecurity (12/06/2022)   Hunger Vital Sign    Worried About Running Out of Food in the Last Year: Never true    Ran Out of Food in the Last Year: Never true  Transportation Needs: No Transportation Needs (12/06/2022)   PRAPARE - Hydrologist (Medical): No    Lack of Transportation (Non-Medical): No  Physical Activity: Not on file  Stress: Not on file  Social Connections: Not on file  Intimate Partner Violence: Not At Risk (12/06/2022)   Humiliation, Afraid, Rape, and Kick questionnaire    Fear of Current or Ex-Partner: No    Emotionally Abused: No    Physically Abused: No    Sexually Abused: No    Family History  Problem Relation Age of Onset    Heart attack Mother    Stroke Father    Breast cancer Cousin     Allergies  Allergen Reactions   Bee Venom Swelling   Wasp Venom Swelling   Yellow Jacket Venom Swelling    Review of Systems  Constitutional: Negative.  Negative for chills, diaphoresis, fever, malaise/fatigue and weight loss.  HENT: Negative.    Eyes: Negative.   Respiratory: Negative.    Cardiovascular:  Negative for chest pain, palpitations, orthopnea and claudication.  Gastrointestinal:  Negative for abdominal pain, blood in stool, constipation, heartburn, nausea and vomiting.  Genitourinary: Negative.   Musculoskeletal:  Positive for joint pain. Negative for myalgias.  Neurological:  Negative for dizziness, tingling, weakness and headaches.  Psychiatric/Behavioral:  Negative for depression. The patient is not nervous/anxious.        Objective:   BP 132/70   Pulse 95   Ht 5\' 3"  (1.6 m)   Wt 204 lb (92.5 kg)   SpO2 97%   BMI 36.14 kg/m   Vitals:   01/05/23 1058  BP: 132/70  Pulse: 95  Height: 5\' 3"  (1.6 m)  Weight: 204 lb (92.5 kg)  SpO2: 97%  BMI (Calculated): 36.15    Physical Exam Vitals and nursing note reviewed.  Constitutional:      Appearance: Normal appearance.  Cardiovascular:     Rate and Rhythm: Normal rate and regular rhythm.  Pulmonary:     Effort: Pulmonary effort is normal.     Breath sounds: Normal breath sounds.  Abdominal:     General: Abdomen is flat. Bowel sounds are normal.     Palpations: Abdomen is soft.     Tenderness: There is no abdominal tenderness. There is no guarding.  Musculoskeletal:        General: Normal range of motion.     Cervical back: Normal range of motion and neck supple.  Neurological:     General: No focal deficit present.     Mental Status: She is alert and oriented to person, place, and time.  Psychiatric:        Mood and Affect: Mood normal.        Behavior: Behavior normal.      Results for orders placed or performed in visit on  01/05/23  POCT CBG (Fasting - Glucose)  Result Value Ref Range   Glucose Fasting, POC 207 (A) 70 - 99 mg/dL        Assessment & Plan:  Patient will get her Farxiga prescription.  Also to  increase metformin to 750 twice a day.  Strict diet control is emphasized. Patient to continue wearing compression stockings.  And to keep her leg elevated when possible. Problem List Items Addressed This Visit     Type 2 diabetes mellitus with hyperglycemia, without long-term current use of insulin ([place]) - Primary   Relevant Orders   POCT CBG (Fasting - Glucose) (Completed)    Return in about 4 weeks (around 02/02/2023).   Total time spent: 25 minutes  Perrin Maltese, MD  01/05/2023

## 2023-01-22 IMAGING — MG MM DIGITAL SCREENING BILAT W/ TOMO AND CAD
8 series · 8 of 24 positions shown · non-contrast
Comparison: Previous exam(s).

CLINICAL DATA: Screening.

EXAM:
DIGITAL SCREENING BILATERAL MAMMOGRAM WITH TOMOSYNTHESIS AND CAD
TECHNIQUE: Bilateral screening digital craniocaudal and mediolateral oblique
mammograms were obtained. Bilateral screening digital breast
tomosynthesis was performed. The images were evaluated with
computer-aided detection.

[R MLO synth-2D]
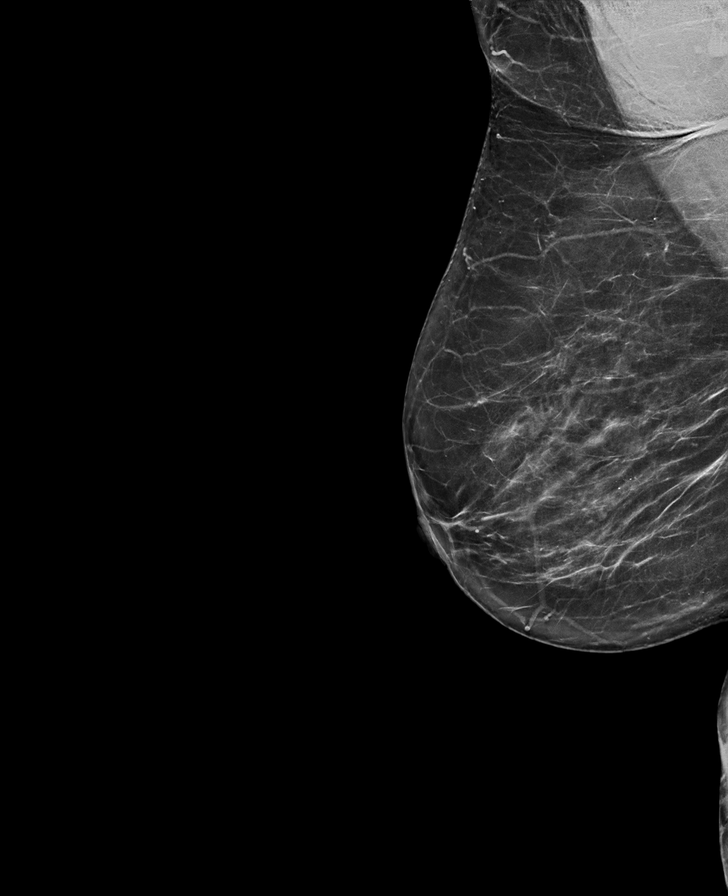

[L CC synth-2D]
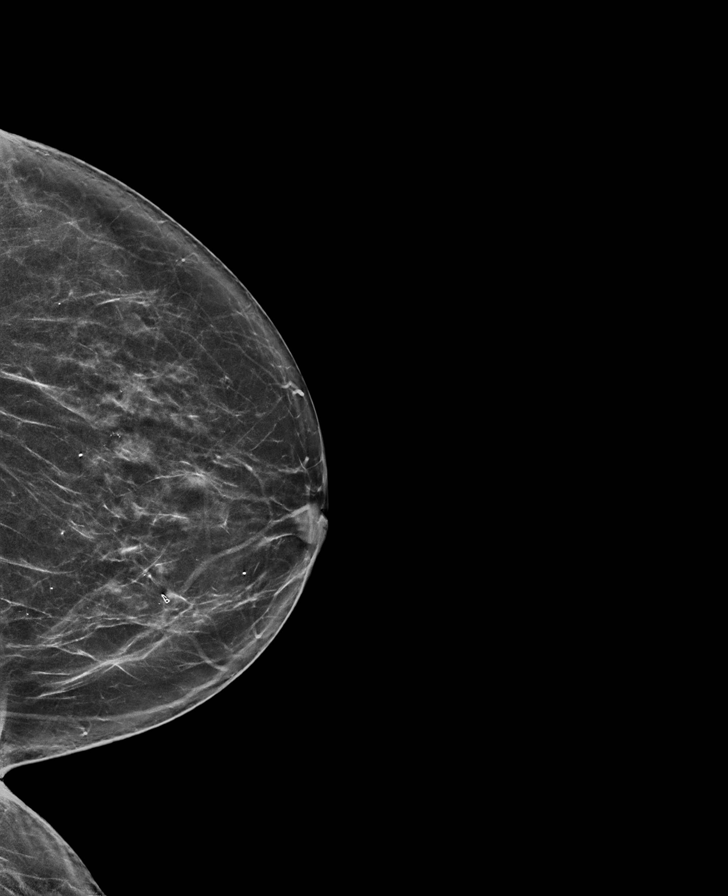

[R CC synth-2D]
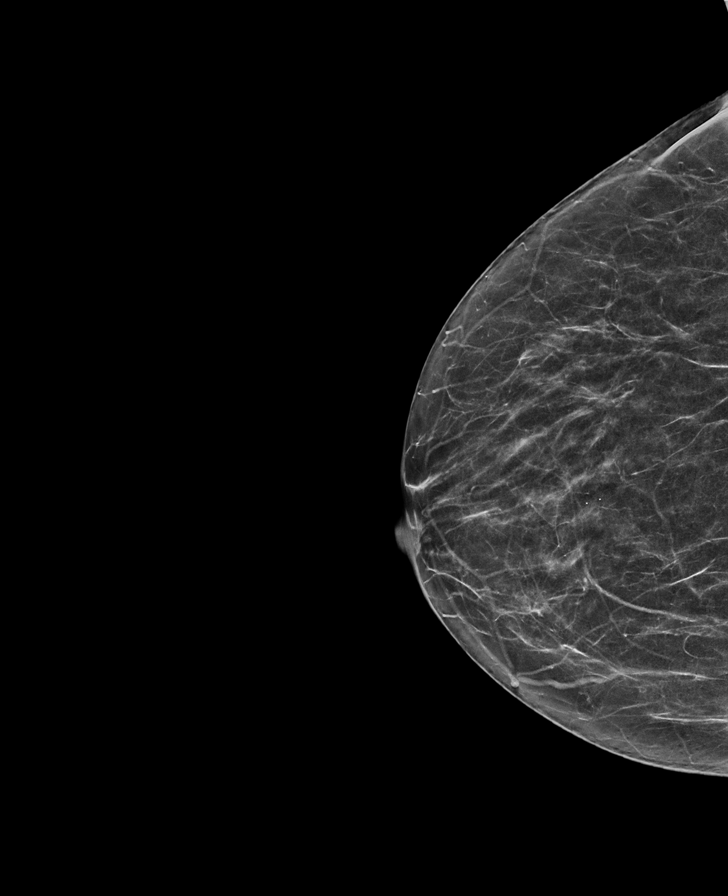

[L MLO synth-2D]
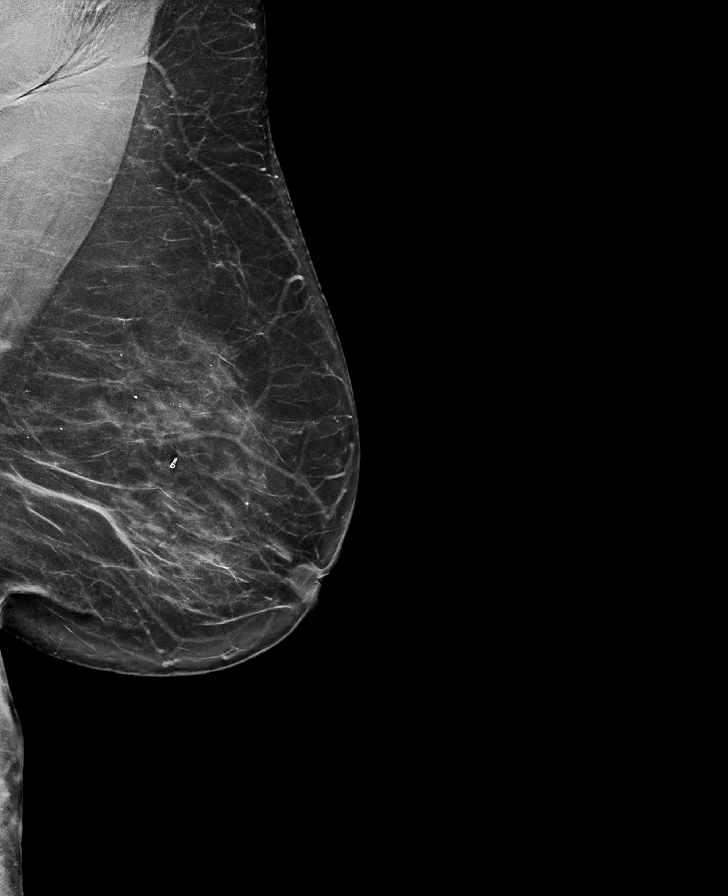

[R CC tomo · tomo slice 34/67.0]
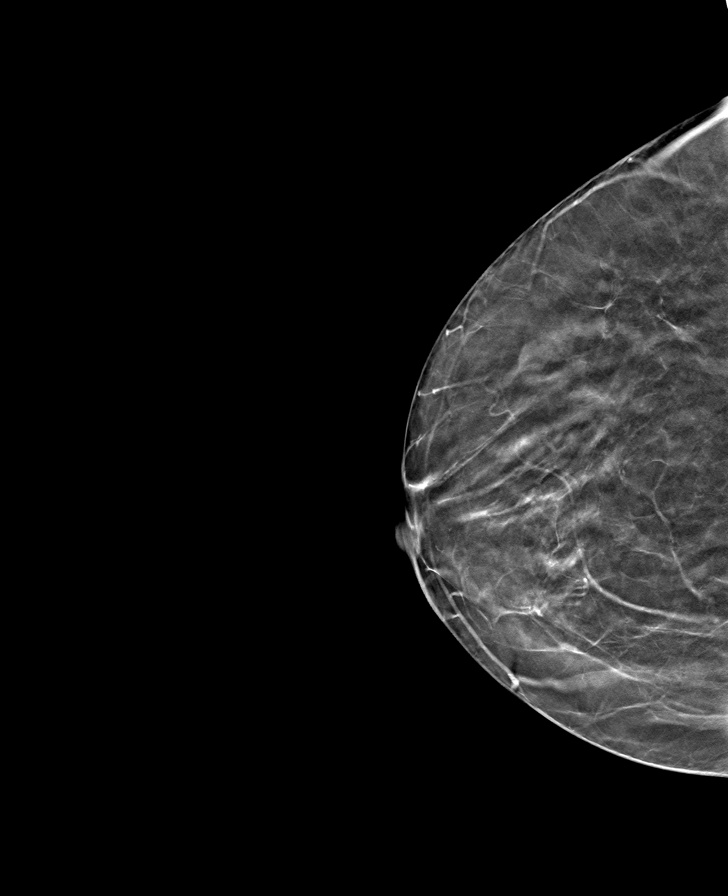

[R MLO tomo · tomo slice 41/82.0]
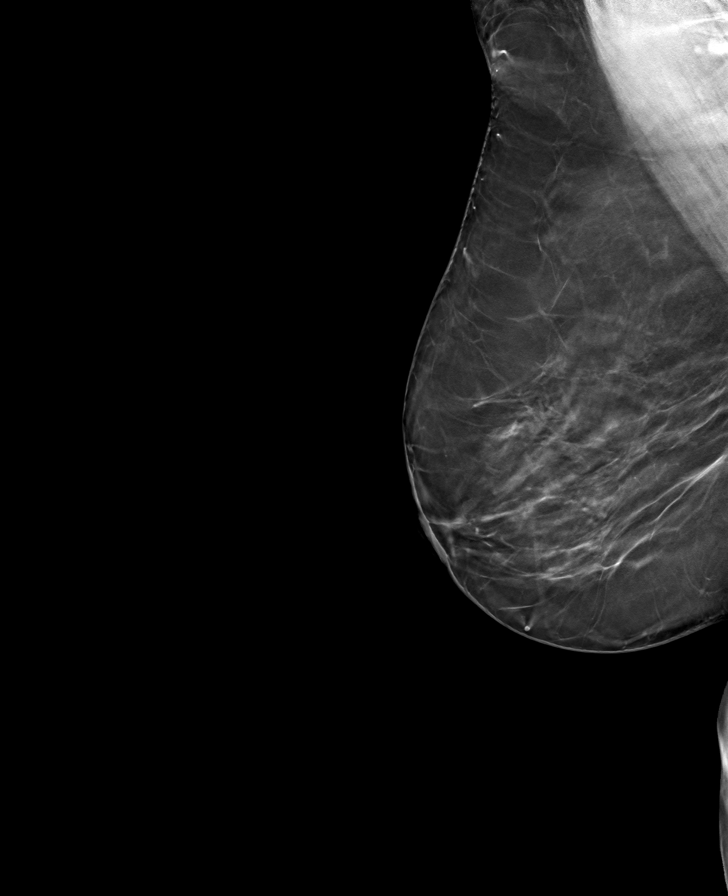

[L CC tomo · tomo slice 39/77.0]
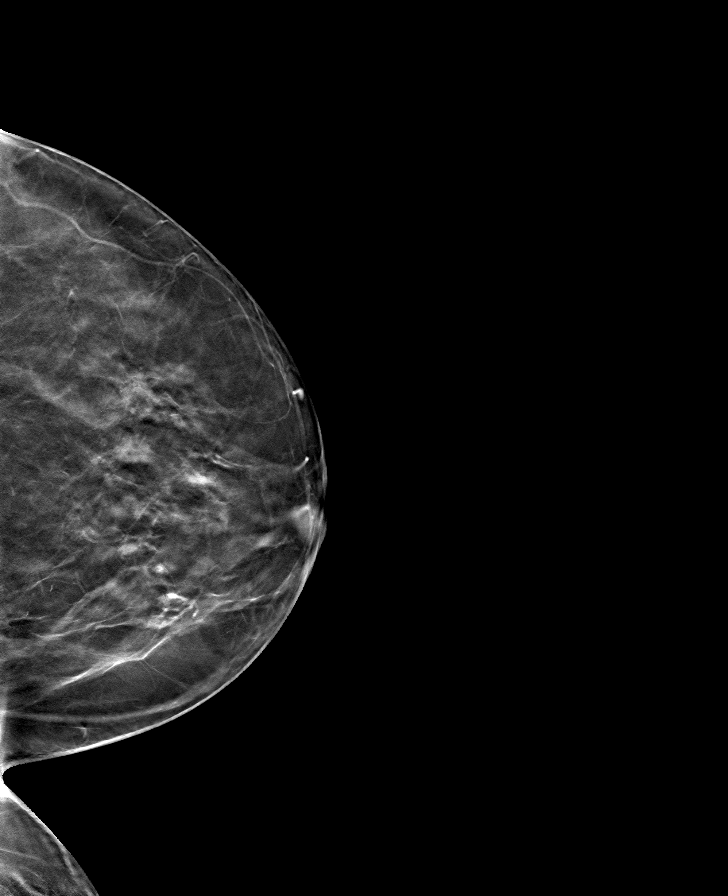

[L MLO tomo · tomo slice 45/89.0]
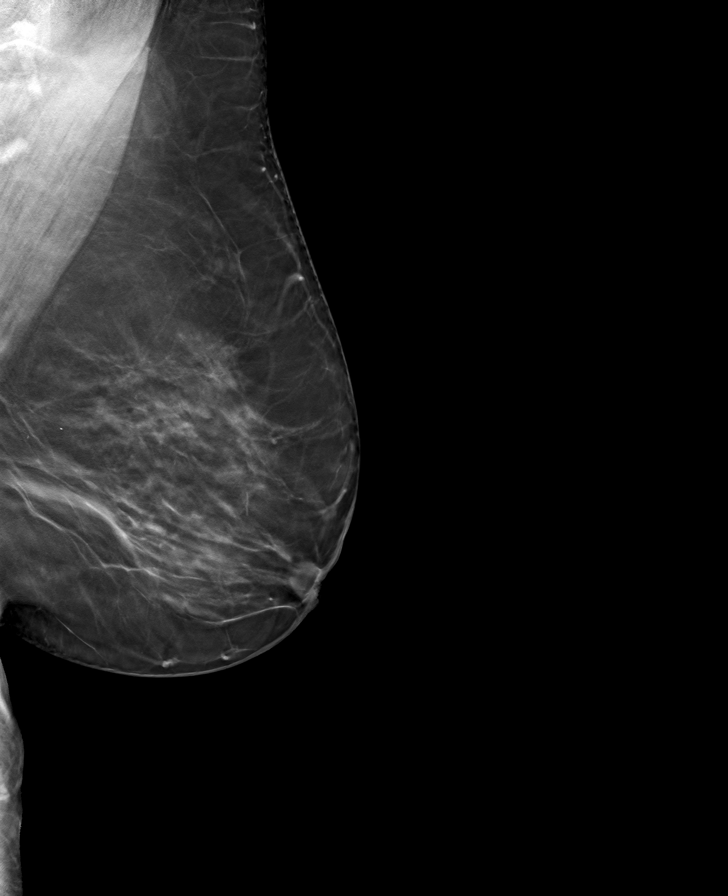

[8 of 24 positions shown; findings below may reference images not displayed]

ACR Breast Density Category b: There are scattered areas of
fibroglandular density.
FINDINGS: There are no findings suspicious for malignancy.
IMPRESSION: No mammographic evidence of malignancy. A result letter of this
screening mammogram will be mailed directly to the patient.

RECOMMENDATION:
Screening mammogram in one year. (Code:51-O-LD2)

BI-RADS CATEGORY  1: Negative.

## 2023-01-25 IMAGING — CT CT HEAD W/O CM
4 series · 16 of 47 positions shown, 18 images · non-contrast
Comparison: No pertinent prior exams available for comparison.

CLINICAL DATA: Provided history: Headache, new or worsening.
Additional history provided: Patient also reports pain in neck.



[Series 2: head bone · axial · 0.42mm/px · z∈[-140,-110]mm · 3 of 77 slices shown]
[im 8/77  bone]
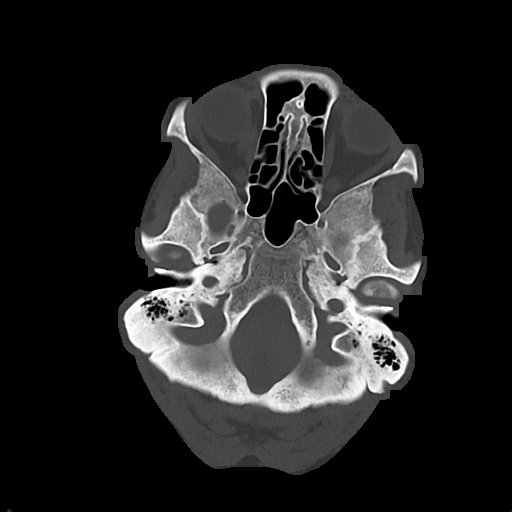
[im 16/77  bone]
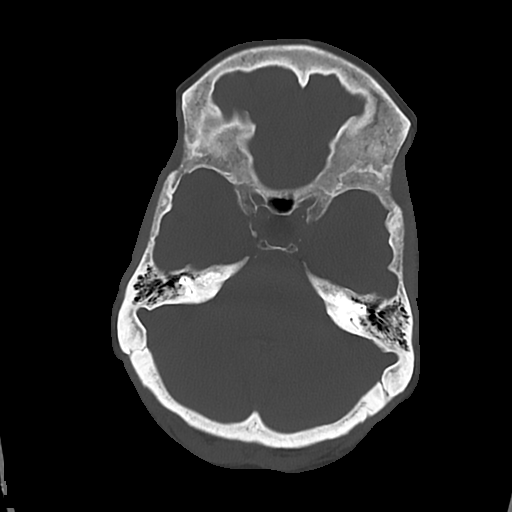
[im 23/77  bone]
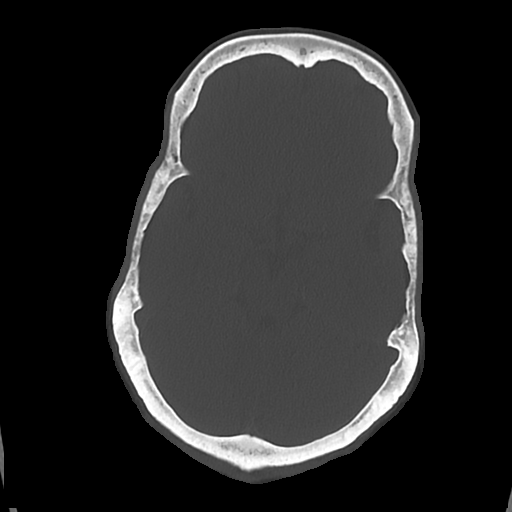

[Series 3: head wo · axial · 0.42mm/px · z∈[-140,-24]mm · 7 of 31 slices shown, 9 images]
[im 4/31  brain]
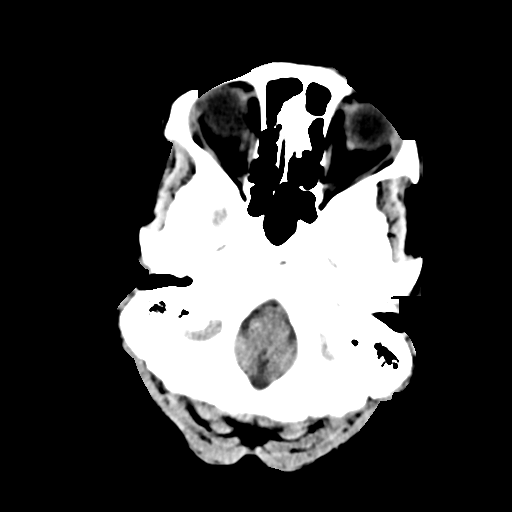
[im 4/31  bone]
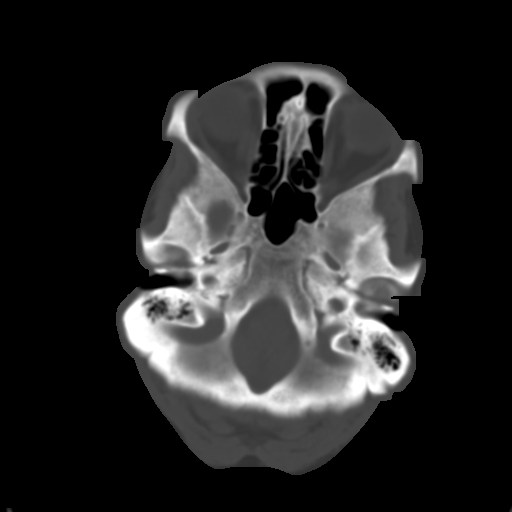
[im 8/31  brain]
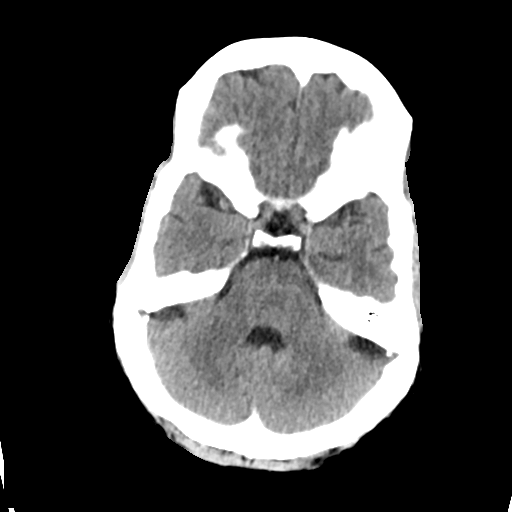
[im 12/31  brain]
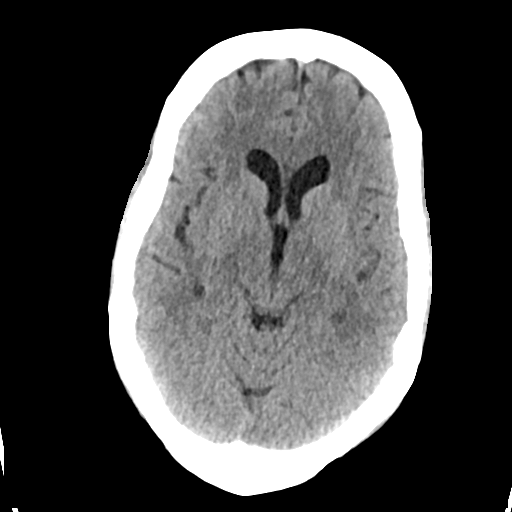
[im 16/31  brain]
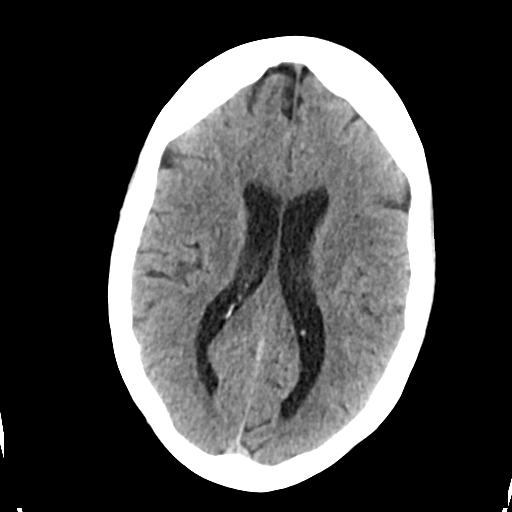
[im 19/31  brain]
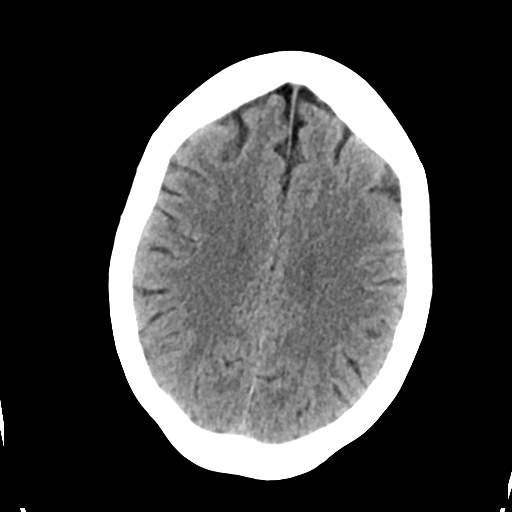
[im 19/31  bone]
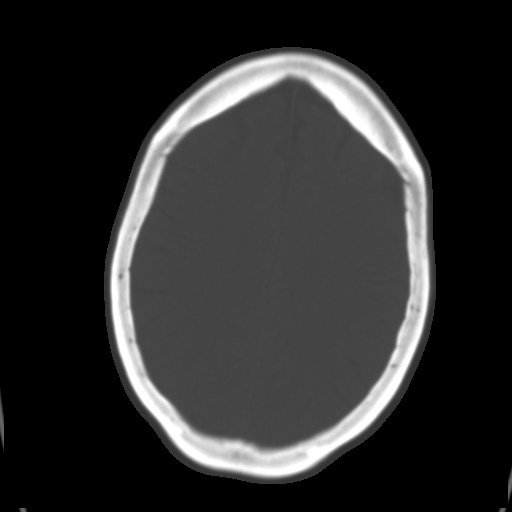
[im 23/31  brain]
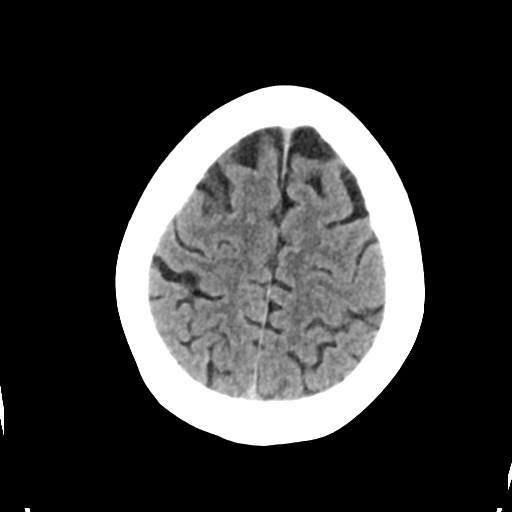
[im 27/31  brain]
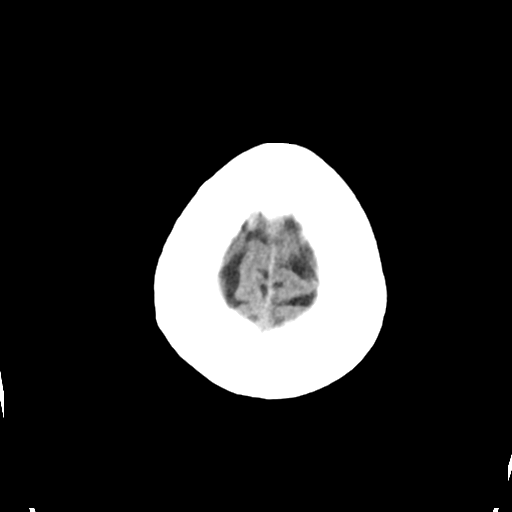

[Series 4: cor soft · coronal · 0.31mm/px · 3 of 67 slices shown]
[im 23/67  brain]
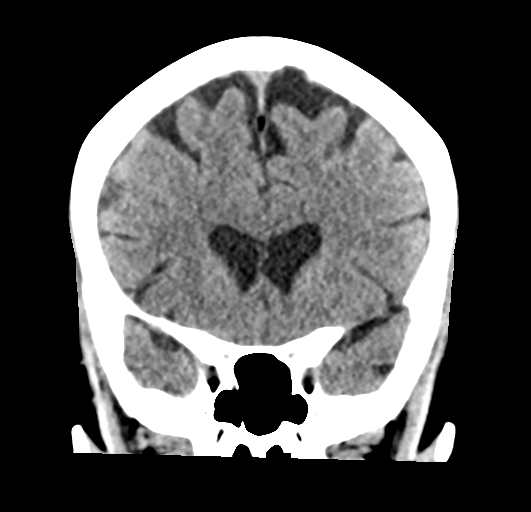
[im 30/67  brain]
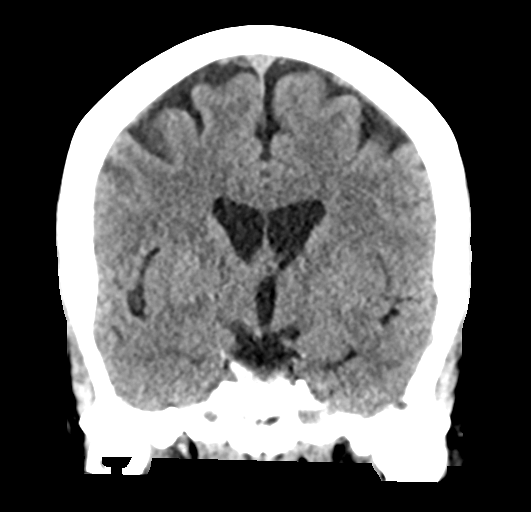
[im 37/67  brain]
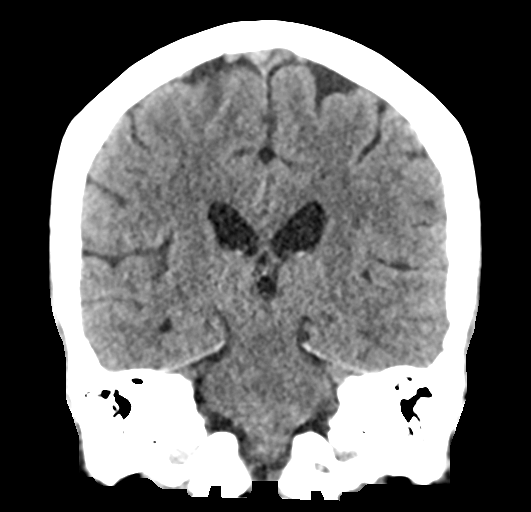

[Series 5: sag soft · sagittal · 0.31mm/px · 3 of 56 slices shown]
[im 19/56  brain]
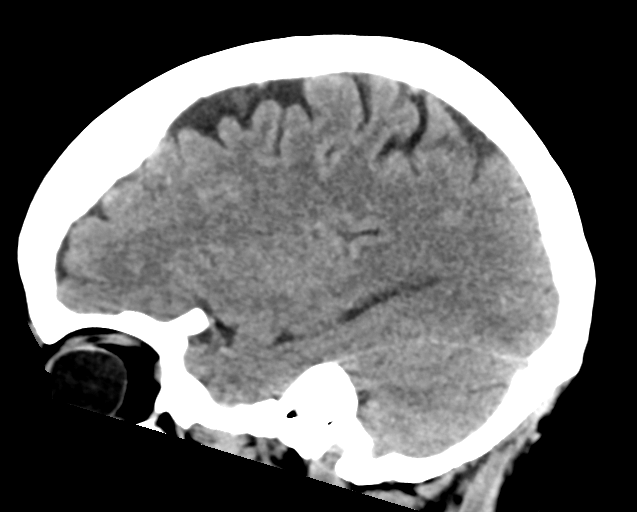
[im 28/56  brain]
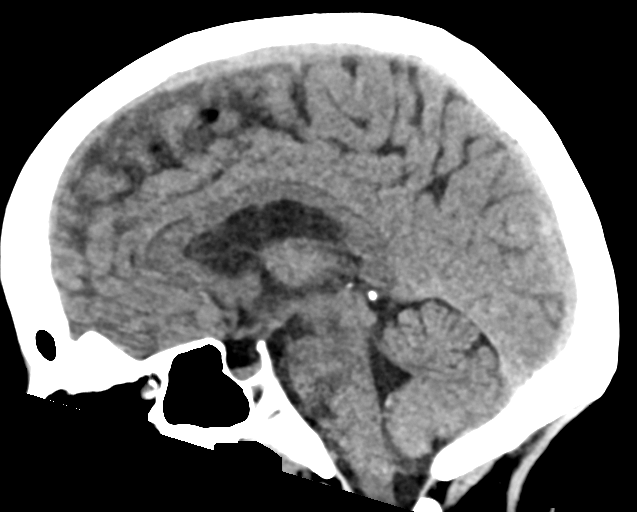
[im 37/56  brain]
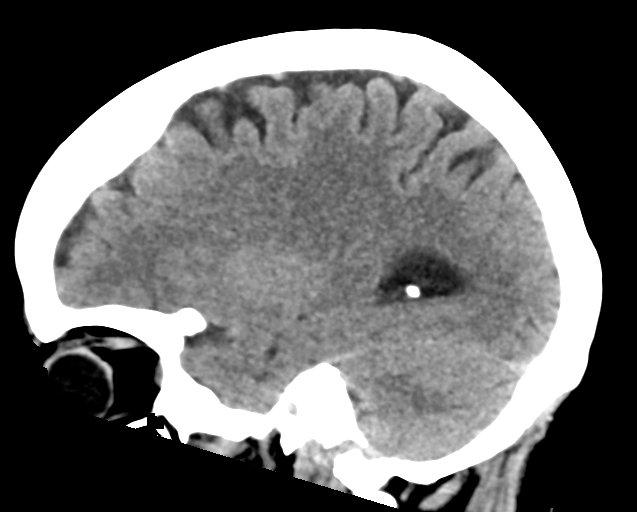

[16 of 47 positions shown; findings below may reference images not displayed]

FINDINGS: Brain:

No age advanced or lobar predominant parenchymal atrophy.

Partially empty sella turcica.

There is no acute intracranial hemorrhage.

No demarcated cortical infarct.

No extra-axial fluid collection.

No evidence of an intracranial mass.

No midline shift.

Vascular: No hyperdense vessel. Atherosclerotic calcifications.

Skull: No fracture or aggressive osseous lesion.

Sinuses/Orbits: No mass or acute finding within the imaged orbits.
No significant paranasal sinus disease at the imaged levels.
IMPRESSION: No evidence of acute intracranial abnormality.

Partially empty sella turcica. While this finding often reflects
incidental anatomic variation, it can also be associated with
idiopathic intracranial hypertension (pseudotumor cerebri).

## 2023-02-02 ENCOUNTER — Ambulatory Visit (INDEPENDENT_AMBULATORY_CARE_PROVIDER_SITE_OTHER): Payer: 59 | Admitting: Internal Medicine

## 2023-02-02 ENCOUNTER — Encounter: Payer: Self-pay | Admitting: Internal Medicine

## 2023-02-02 VITALS — BP 130/62 | HR 106 | Ht 63.0 in | Wt 199.8 lb

## 2023-02-02 DIAGNOSIS — I10 Essential (primary) hypertension: Secondary | ICD-10-CM | POA: Diagnosis not present

## 2023-02-02 DIAGNOSIS — E782 Mixed hyperlipidemia: Secondary | ICD-10-CM | POA: Diagnosis not present

## 2023-02-02 DIAGNOSIS — M25473 Effusion, unspecified ankle: Secondary | ICD-10-CM | POA: Diagnosis not present

## 2023-02-02 DIAGNOSIS — E1165 Type 2 diabetes mellitus with hyperglycemia: Secondary | ICD-10-CM | POA: Diagnosis not present

## 2023-02-02 LAB — POCT CBG (FASTING - GLUCOSE)-MANUAL ENTRY: Glucose Fasting, POC: 94 mg/dL (ref 70–99)

## 2023-02-02 NOTE — Progress Notes (Signed)
Established Patient Office Visit  Subjective:  Patient ID: Judy Barber, female    DOB: Mar 01, 1956  Age: 67 y.o. MRN: [id number]  Chief Complaint  Patient presents with   Follow-up    4 week follow up    Patient comes in for her follow-up today.  She shows an improvement in her sugar control.  And she has also lost weight with diet control.  Her most recent hemoglobin A1c is higher than before which could have been found probably due to dietary indiscretion after her knee replacement.  But now patient is being more attention to her diet and is  more physically active.  She still has not been able to get [place].  She will however increase her metformin to 750 mg twice a day and see if it she can tolerate it. Her knee is healing quite nicely.  And she has reduced swelling in her right ankle now. Patient advised to wear compression stockings and also try to keep her feet elevated when possible.    No other concerns at this time.   Past Medical History:  Diagnosis Date   Diabetes mellitus type 2, controlled    Heart murmur    History of atrial fibrillation    a.) s/p cardiac ablation 2005; no longer sees cardiology; no interventions for rate control or chronic anticoagulation therapy at this point   History of kidney stones    Hyperlipidemia    Hypertension    Osteoarthritis of both knees    Pneumonia    Pulmonary sarcoidosis ([facility])    Sleep apnea    has never received cpap   Vitamin D deficiency     Past Surgical History:  Procedure Laterality Date   APPENDECTOMY  1980   BREAST BIOPSY Left 2017   benign   BREAST EXCISIONAL BIOPSY Right 2013   benign   CARDIAC ELECTROPHYSIOLOGY MAPPING AND ABLATION  2005   COLONOSCOPY  2019   KNEE ARTHROSCOPY W/ MENISCECTOMY Right 03/01/2017   LUNG BIOPSY  2005   sarcoidosis   PARTIAL KNEE ARTHROPLASTY Left 02/17/2022   Procedure: UNICOMPARTMENTAL KNEE;  Surgeon: Christena Flake, MD;  Location: [facility];  Service: Orthopedics;   Laterality: Left;   STRABISMUS SURGERY Bilateral    as a child   TOTAL KNEE ARTHROPLASTY Right 12/06/2022   Procedure: TOTAL KNEE ARTHROPLASTY;  Surgeon: Christena Flake, MD;  Location: [facility];  Service: Orthopedics;  Laterality: Right;   TUBAL LIGATION      Social History   Socioeconomic History   Marital status: Divorced    Spouse name: Not on file   Number of children: 0   Years of education: Not on file   Highest education level: Not on file  Occupational History   Not on file  Tobacco Use   Smoking status: Former    Packs/day: 0.50    Years: 25.00    Additional pack years: 0.00    Total pack years: 12.50    Types: Cigarettes    Quit date: 2005    Years since quitting: 19.3   Smokeless tobacco: Never  Vaping Use   Vaping Use: Never used  Substance and Sexual Activity   Alcohol use: Not Currently   Drug use: Never   Sexual activity: Not on file  Other Topics Concern   Not on file  Social History Narrative   Lives with niece   Social Determinants of Health   Financial Resource Strain: Not on file  Food Insecurity: No  Food Insecurity (12/06/2022)   Hunger Vital Sign    Worried About Running Out of Food in the Last Year: Never true    Ran Out of Food in the Last Year: Never true  Transportation Needs: No Transportation Needs (12/06/2022)   PRAPARE - Administrator, Civil Service (Medical): No    Lack of Transportation (Non-Medical): No  Physical Activity: Not on file  Stress: Not on file  Social Connections: Not on file  Intimate Partner Violence: Not At Risk (12/06/2022)   Humiliation, Afraid, Rape, and Kick questionnaire    Fear of Current or Ex-Partner: No    Emotionally Abused: No    Physically Abused: No    Sexually Abused: No    Family History  Problem Relation Age of Onset   Heart attack Mother    Stroke Father    Breast cancer Cousin     Allergies  Allergen Reactions   Bee Venom Swelling   Wasp Venom Swelling   Yellow Jacket Venom  Swelling    Review of Systems  Constitutional:  Negative for fever, malaise/fatigue and weight loss.  HENT: Negative.    Eyes: Negative.   Respiratory:  Negative for cough, shortness of breath and wheezing.   Cardiovascular:  Negative for palpitations, claudication and leg swelling.  Gastrointestinal:  Negative for abdominal pain, constipation, diarrhea, heartburn, nausea and vomiting.  Genitourinary:  Negative for dysuria and urgency.  Musculoskeletal:  Positive for joint pain. Negative for falls, myalgias and neck pain.  Skin: Negative.   Neurological:  Negative for dizziness, tingling, tremors, sensory change, speech change and headaches.  Psychiatric/Behavioral:  Negative for depression, hallucinations, memory loss, substance abuse and suicidal ideas. The patient is not nervous/anxious and does not have insomnia.        Objective:   BP 130/62   Pulse (!) 106   Ht  (1.6 m)   Wt 199 lb 12.8 oz (90.6 kg)   SpO2 95%   BMI 35.39 kg/m   Vitals:   02/02/23 1346  BP: 130/62  Pulse: (!) 106  Height:  (1.6 m)  Weight: 199 lb 12.8 oz (90.6 kg)  SpO2: 95%  BMI (Calculated): 35.4    Physical Exam   Results for orders placed or performed in visit on 02/02/23  POCT CBG (Fasting - Glucose)  Result Value Ref Range   Glucose Fasting, POC 94 70 - 99 mg/dL        Assessment & Plan:  Patient advised to take her medications regularly.  She is to maintain a strict diet control.  Advised to keep her legs elevated.  Also to try metformin 750 mg p.o. twice a day. Problem List Items Addressed This Visit     Essential hypertension, benign   Type 2 diabetes mellitus with hyperglycemia, without long-term current use of insulin - Primary   Relevant Orders   POCT CBG (Fasting - Glucose) (Completed)   Ankle edema   Mixed hyperlipidemia    Return in about 2 months (around 04/04/2023).   Total time spent: 30 minutes  Margaretann Loveless, MD  02/02/2023

## 2023-02-13 ENCOUNTER — Other Ambulatory Visit: Payer: Self-pay | Admitting: Internal Medicine

## 2023-02-13 DIAGNOSIS — E782 Mixed hyperlipidemia: Secondary | ICD-10-CM

## 2023-02-13 DIAGNOSIS — I10 Essential (primary) hypertension: Secondary | ICD-10-CM

## 2023-03-02 ENCOUNTER — Other Ambulatory Visit: Payer: Self-pay | Admitting: Internal Medicine

## 2023-03-02 DIAGNOSIS — Z1231 Encounter for screening mammogram for malignant neoplasm of breast: Secondary | ICD-10-CM

## 2023-03-07 ENCOUNTER — Other Ambulatory Visit: Payer: Self-pay | Admitting: Internal Medicine

## 2023-03-07 DIAGNOSIS — M25473 Effusion, unspecified ankle: Secondary | ICD-10-CM

## 2023-03-16 ENCOUNTER — Ambulatory Visit
Admission: RE | Admit: 2023-03-16 | Discharge: 2023-03-16 | Disposition: A | Discharge status: Home / Self Care | Payer: 59 | Source: Ambulatory Visit | Attending: Internal Medicine | Admitting: Internal Medicine

## 2023-03-16 DIAGNOSIS — Z1231 Encounter for screening mammogram for malignant neoplasm of breast: Secondary | ICD-10-CM | POA: Diagnosis present

## 2023-03-27 ENCOUNTER — Other Ambulatory Visit: Payer: Self-pay | Admitting: Internal Medicine

## 2023-03-27 DIAGNOSIS — K219 Gastro-esophageal reflux disease without esophagitis: Secondary | ICD-10-CM

## 2023-04-04 ENCOUNTER — Ambulatory Visit (INDEPENDENT_AMBULATORY_CARE_PROVIDER_SITE_OTHER): Payer: 59 | Admitting: Internal Medicine

## 2023-04-04 ENCOUNTER — Encounter: Payer: Self-pay | Admitting: Internal Medicine

## 2023-04-04 VITALS — BP 130/60 | HR 87 | Ht 63.0 in | Wt 200.6 lb

## 2023-04-04 DIAGNOSIS — E559 Vitamin D deficiency, unspecified: Secondary | ICD-10-CM | POA: Diagnosis not present

## 2023-04-04 DIAGNOSIS — E782 Mixed hyperlipidemia: Secondary | ICD-10-CM | POA: Diagnosis not present

## 2023-04-04 DIAGNOSIS — I10 Essential (primary) hypertension: Secondary | ICD-10-CM | POA: Diagnosis not present

## 2023-04-04 DIAGNOSIS — E1165 Type 2 diabetes mellitus with hyperglycemia: Secondary | ICD-10-CM | POA: Diagnosis not present

## 2023-04-04 LAB — POCT CBG (FASTING - GLUCOSE)-MANUAL ENTRY: Glucose Fasting, POC: 90 mg/dL (ref 70–99)

## 2023-04-04 MED ORDER — METFORMIN HCL 850 MG PO TABS: 850.0000 mg | ORAL_TABLET | Freq: Two times a day (BID) | ORAL | 6 refills | Status: DC

## 2023-04-04 MED ORDER — DAPAGLIFLOZIN PROPANEDIOL 10 MG PO TABS: 10.0000 mg | ORAL_TABLET | Freq: Every day | ORAL | 3 refills | Status: DC

## 2023-04-04 NOTE — Progress Notes (Signed)
Established Patient Office Visit  Subjective:  Patient ID: Judy Barber, female    DOB: May 17, 1956  Age: 67 y.o. MRN: [id number]  No chief complaint on file.   Patient in office for 2 month follow up. Patient complains of right knee pain and edema, s/p right knee replacement 11/2012. Feeling well otherwise. Finished PT. Seeing ortho 04/10/23 for ongoing pain and edema. Dexa scan completed 05/2022. Colonoscopy at [facility] 08/2018.  Fasting labs today. Has not been taking [place], unable to get due to financial reasons.     No other concerns at this time.   Past Medical History:  Diagnosis Date   Diabetes mellitus type 2, controlled ([facility])    Heart murmur    History of atrial fibrillation    a.) s/p cardiac ablation 2005; no longer sees cardiology; no interventions for rate control or chronic anticoagulation therapy at this point   History of kidney stones    Hyperlipidemia    Hypertension    Osteoarthritis of both knees    Pneumonia    Pulmonary sarcoidosis ([facility])    Sleep apnea    has never received cpap   Vitamin D deficiency     Past Surgical History:  Procedure Laterality Date   APPENDECTOMY  1980   BREAST BIOPSY Left 2017   benign   BREAST EXCISIONAL BIOPSY Right 2013   benign   CARDIAC ELECTROPHYSIOLOGY MAPPING AND ABLATION  2005   COLONOSCOPY  2019   KNEE ARTHROSCOPY W/ MENISCECTOMY Right 03/01/2017   LUNG BIOPSY  2005   sarcoidosis   PARTIAL KNEE ARTHROPLASTY Left 02/17/2022   Procedure: UNICOMPARTMENTAL KNEE;  Surgeon: Christena Flake, MD;  Location: [facility] OR[facility];  Service: Orthopedics;  Laterality: Left;   STRABISMUS SURGERY Bilateral    as a child   TOTAL KNEE ARTHROPLASTY Right 12/06/2022   Procedure: TOTAL KNEE ARTHROPLASTY;  Surgeon: Christena Flake, MD;  Location: [facility] OR[facility];  Service: Orthopedics;  Laterality: Right;   TUBAL LIGATION      Social History   Socioeconomic History   Marital status: Divorced    Spouse name: Not on file   Number of children: 0    Years of education: Not on file   Highest education level: Not on file  Occupational History   Not on file  Tobacco Use   Smoking status: Former    Packs/day: 0.50    Years: 25.00    Additional pack years: 0.00    Total pack years: 12.50    Types: Cigarettes    Quit date: 2005    Years since quitting: 19.4   Smokeless tobacco: Never  Vaping Use   Vaping Use: Never used  Substance and Sexual Activity   Alcohol use: Not Currently   Drug use: Never   Sexual activity: Not on file  Other Topics Concern   Not on file  Social History Narrative   Lives with niece   Social Determinants of Health   Financial Resource Strain: Not on file  Food Insecurity: No Food Insecurity (12/06/2022)   Hunger Vital Sign    Worried About Running Out of Food in the Last Year: Never true    Ran Out of Food in the Last Year: Never true  Transportation Needs: No Transportation Needs (12/06/2022)   PRAPARE - Administrator, Civil Service (Medical): No    Lack of Transportation (Non-Medical): No  Physical Activity: Not on file  Stress: Not on file  Social Connections: Not on file  Intimate  Partner Violence: Not At Risk (12/06/2022)   Humiliation, Afraid, Rape, and Kick questionnaire    Fear of Current or Ex-Partner: No    Emotionally Abused: No    Physically Abused: No    Sexually Abused: No    Family History  Problem Relation Age of Onset   Heart attack Mother    Stroke Father    Breast cancer Cousin     Allergies  Allergen Reactions   Bee Venom Swelling   Wasp Venom Swelling   Yellow Jacket Venom Swelling    Review of Systems  Constitutional: Negative.   HENT: Negative.    Eyes: Negative.   Respiratory: Negative.  Negative for cough and shortness of breath.   Cardiovascular:  Positive for leg swelling. Negative for chest pain and palpitations.  Gastrointestinal: Negative.  Negative for abdominal pain, constipation, diarrhea, heartburn, nausea and vomiting.   Genitourinary: Negative.  Negative for dysuria and flank pain.  Musculoskeletal: Negative.  Negative for joint pain and myalgias.  Skin: Negative.   Neurological: Negative.  Negative for dizziness and headaches.  Endo/Heme/Allergies: Negative.   Psychiatric/Behavioral: Negative.  Negative for depression and suicidal ideas. The patient is not nervous/anxious.        Objective:   BP 130/60   Pulse 87   Ht 5\' 3"  (1.6 m)   Wt 200 lb 9.6 oz (91 kg)   SpO2 98%   BMI 35.53 kg/m   Vitals:   04/04/23 1349  BP: 130/60  Pulse: 87  Height: 5\' 3"  (1.6 m)  Weight: 200 lb 9.6 oz (91 kg)  SpO2: 98%  BMI (Calculated): 35.54    Physical Exam Vitals and nursing note reviewed.  Constitutional:      Appearance: Normal appearance.  HENT:     Head: Normocephalic and atraumatic.     Nose: Nose normal.  Cardiovascular:     Rate and Rhythm: Normal rate and regular rhythm.     Pulses: Normal pulses.     Heart sounds: Normal heart sounds. No murmur heard. Pulmonary:     Effort: Pulmonary effort is normal.     Breath sounds: Normal breath sounds. No wheezing.  Abdominal:     General: Bowel sounds are normal.     Palpations: Abdomen is soft.     Tenderness: There is no abdominal tenderness. There is no right CVA tenderness or left CVA tenderness.  Musculoskeletal:        General: Normal range of motion.     Cervical back: Normal range of motion.     Right lower leg: Edema present.     Left lower leg: No edema.  Skin:    General: Skin is warm and dry.  Neurological:     General: No focal deficit present.     Mental Status: She is alert and oriented to person, place, and time.  Psychiatric:        Mood and Affect: Mood normal.        Behavior: Behavior normal.      Results for orders placed or performed in visit on 04/04/23  POCT CBG (Fasting - Glucose)  Result Value Ref Range   Glucose Fasting, POC 90 70 - 99 mg/dL    Recent Results (from the past 2160 hour(s))  POCT CBG  (Fasting - Glucose)     Status: Abnormal   Collection Time: 01/05/23 11:02 AM  Result Value Ref Range   Glucose Fasting, POC 207 (A) 70 - 99 mg/dL  POCT CBG (Fasting - Glucose)  Status: Normal   Collection Time: 02/02/23  1:48 PM  Result Value Ref Range   Glucose Fasting, POC 94 70 - 99 mg/dL  POCT CBG (Fasting - Glucose)     Status: None   Collection Time: 04/04/23  2:03 PM  Result Value Ref Range   Glucose Fasting, POC 90 70 - 99 mg/dL      Assessment & Plan:  Keep appointment with orthopaedics next week. Fasting labs today. Continue to watch diet and exercise as tolerated. Elevated legs to prevent dependant edema.   Problem List Items Addressed This Visit     Essential hypertension, benign - Primary   Relevant Orders   CMP14+EGFR   Type 2 diabetes mellitus with hyperglycemia, without long-term current use of insulin (HCC)   Relevant Medications   metFORMIN (GLUCOPHAGE) 850 MG tablet   dapagliflozin propanediol (FARXIGA) 10 MG TABS tablet   Other Relevant Orders   POCT CBG (Fasting - Glucose) (Completed)   CMP14+EGFR   HgB A1c   Mixed hyperlipidemia   Relevant Orders   CMP14+EGFR   Lipid Profile   Other Visit Diagnoses     Vitamin D deficiency       Relevant Orders   CBC With Differential       Return in about 4 months (around 08/04/2023).   Total time spent: 25 minutes  Margaretann Loveless, MD  04/04/2023   This document may have been prepared by [facility] Voice Recognition software and as such may include unintentional dictation errors.

## 2023-04-04 NOTE — Progress Notes (Signed)
Established Patient Office Visit  Subjective:  Patient ID: Judy Barber, female    DOB: Jun 12, 1956  Age: 67 y.o. MRN: [id number]  No chief complaint on file.   Patient comes in for follow up. Generally feels well. Except for her right knee pain and swelling s/p TKR in Feb/2024.Also gets occasional right ankle swelling. Labs today.    No other concerns at this time.   Past Medical History:  Diagnosis Date   Diabetes mellitus type 2, controlled (HCC)    Heart murmur    History of atrial fibrillation    a.) s/p cardiac ablation 2005; no longer sees cardiology; no interventions for rate control or chronic anticoagulation therapy at this point   History of kidney stones    Hyperlipidemia    Hypertension    Osteoarthritis of both knees    Pneumonia    Pulmonary sarcoidosis (HCC)    Sleep apnea    has never received cpap   Vitamin D deficiency     Past Surgical History:  Procedure Laterality Date   APPENDECTOMY  1980   BREAST BIOPSY Left 2017   benign   BREAST EXCISIONAL BIOPSY Right 2013   benign   CARDIAC ELECTROPHYSIOLOGY MAPPING AND ABLATION  2005   COLONOSCOPY  2019   KNEE ARTHROSCOPY W/ MENISCECTOMY Right 03/01/2017   LUNG BIOPSY  2005   sarcoidosis   PARTIAL KNEE ARTHROPLASTY Left 02/17/2022   Procedure: UNICOMPARTMENTAL KNEE;  Surgeon: Christena Flake, MD;  Location: [facility];  Service: Orthopedics;  Laterality: Left;   STRABISMUS SURGERY Bilateral    as a child   TOTAL KNEE ARTHROPLASTY Right 12/06/2022   Procedure: TOTAL KNEE ARTHROPLASTY;  Surgeon: Christena Flake, MD;  Location: [facility];  Service: Orthopedics;  Laterality: Right;   TUBAL LIGATION      Social History   Socioeconomic History   Marital status: Divorced    Spouse name: Not on file   Number of children: 0   Years of education: Not on file   Highest education level: Not on file  Occupational History   Not on file  Tobacco Use   Smoking status: Former    Packs/day: 0.50    Years: 25.00     Additional pack years: 0.00    Total pack years: 12.50    Types: Cigarettes    Quit date: 2005    Years since quitting: 19.4   Smokeless tobacco: Never  Vaping Use   Vaping Use: Never used  Substance and Sexual Activity   Alcohol use: Not Currently   Drug use: Never   Sexual activity: Not on file  Other Topics Concern   Not on file  Social History Narrative   Lives with niece   Social Determinants of Health   Financial Resource Strain: Not on file  Food Insecurity: No Food Insecurity (12/06/2022)   Hunger Vital Sign    Worried About Running Out of Food in the Last Year: Never true    Ran Out of Food in the Last Year: Never true  Transportation Needs: No Transportation Needs (12/06/2022)   PRAPARE - Administrator, Civil Service (Medical): No    Lack of Transportation (Non-Medical): No  Physical Activity: Not on file  Stress: Not on file  Social Connections: Not on file  Intimate Partner Violence: Not At Risk (12/06/2022)   Humiliation, Afraid, Rape, and Kick questionnaire    Fear of Current or Ex-Partner: No    Emotionally Abused: No  Physically Abused: No    Sexually Abused: No    Family History  Problem Relation Age of Onset   Heart attack Mother    Stroke Father    Breast cancer Cousin     Allergies  Allergen Reactions   Bee Venom Swelling   Wasp Venom Swelling   Yellow Jacket Venom Swelling    Review of Systems  Constitutional: Negative.  Negative for chills, fever and malaise/fatigue.  HENT: Negative.  Negative for congestion, nosebleeds, sinus pain and sore throat.   Eyes: Negative.   Respiratory:  Negative for cough, shortness of breath and wheezing.   Cardiovascular:  Negative for chest pain, palpitations, leg swelling and PND.  Gastrointestinal:  Negative for blood in stool, constipation, melena and vomiting.  Genitourinary: Negative.   Musculoskeletal:  Positive for joint pain. Negative for back pain, myalgias and neck pain.   Neurological: Negative.  Negative for dizziness and headaches.  Psychiatric/Behavioral:  Negative for depression and suicidal ideas.        Objective:   BP 130/60   Pulse 87   Ht 5\' 3"  (1.6 m)   Wt 200 lb 9.6 oz (91 kg)   SpO2 98%   BMI 35.53 kg/m   Vitals:   04/04/23 1349  BP: 130/60  Pulse: 87  Height: 5\' 3"  (1.6 m)  Weight: 200 lb 9.6 oz (91 kg)  SpO2: 98%  BMI (Calculated): 35.54    Physical Exam Vitals reviewed.  HENT:     Mouth/Throat:     Pharynx: No oropharyngeal exudate or posterior oropharyngeal erythema.  Cardiovascular:     Rate and Rhythm: Normal rate.     Pulses: Normal pulses.     Heart sounds: Normal heart sounds. No murmur heard. Pulmonary:     Effort: Pulmonary effort is normal.     Breath sounds: No rales.  Chest:     Chest wall: No tenderness.  Abdominal:     General: Bowel sounds are normal.     Palpations: Abdomen is soft. There is no mass.     Tenderness: There is no abdominal tenderness. There is no right CVA tenderness or left CVA tenderness.  Musculoskeletal:        General: No tenderness, deformity or signs of injury. Normal range of motion.     Cervical back: Normal range of motion and neck supple.     Right lower leg: Edema present.     Left lower leg: No edema.  Neurological:     General: No focal deficit present.     Mental Status: She is alert and oriented to person, place, and time.  Psychiatric:        Mood and Affect: Mood normal.        Behavior: Behavior normal.      Results for orders placed or performed in visit on 04/04/23  POCT CBG (Fasting - Glucose)  Result Value Ref Range   Glucose Fasting, POC 90 70 - 99 mg/dL    Recent Results (from the past 2160 hour(s))  POCT CBG (Fasting - Glucose)     Status: Abnormal   Collection Time: 01/05/23 11:02 AM  Result Value Ref Range   Glucose Fasting, POC 207 (A) 70 - 99 mg/dL  POCT CBG (Fasting - Glucose)     Status: Normal   Collection Time: 02/02/23  1:48 PM   Result Value Ref Range   Glucose Fasting, POC 94 70 - 99 mg/dL  POCT CBG (Fasting - Glucose)  Status: None   Collection Time: 04/04/23  2:03 PM  Result Value Ref Range   Glucose Fasting, POC 90 70 - 99 mg/dL      Assessment & Plan:  Patient to continue all meds. Labs today. Problem List Items Addressed This Visit     Essential hypertension, benign - Primary   Relevant Orders   CMP14+EGFR   Type 2 diabetes mellitus with hyperglycemia, without long-term current use of insulin (HCC)   Relevant Medications   metFORMIN (GLUCOPHAGE) 850 MG tablet   dapagliflozin propanediol (FARXIGA) 10 MG TABS tablet   Other Relevant Orders   POCT CBG (Fasting - Glucose) (Completed)   CMP14+EGFR   HgB A1c   Mixed hyperlipidemia   Relevant Orders   CMP14+EGFR   Lipid Profile   Other Visit Diagnoses     Vitamin D deficiency       Relevant Orders   CBC With Differential       Return in about 4 months (around 08/04/2023).   Total time spent: 30 minutes  Margaretann Loveless, MD  04/04/2023   This document may have been prepared by [employer] Voice Recognition software and as such may include unintentional dictation errors.

## 2023-04-05 LAB — CMP14+EGFR
ALT: 10 IU/L (ref 0–32)
AST: 15 IU/L (ref 0–40)
Albumin: 4.3 g/dL (ref 3.9–4.9)
Alkaline Phosphatase: 98 IU/L (ref 44–121)
BUN/Creatinine Ratio: 16 (ref 12–28)
BUN: 13 mg/dL (ref 8–27)
Bilirubin Total: 0.3 mg/dL (ref 0.0–1.2)
CO2: 25 mmol/L (ref 20–29)
Calcium: 9.6 mg/dL (ref 8.7–10.3)
Chloride: 104 mmol/L (ref 96–106)
Creatinine, Ser: 0.82 mg/dL (ref 0.57–1.00)
Globulin, Total: 2.9 g/dL (ref 1.5–4.5)
Glucose: 119 mg/dL — ABNORMAL HIGH (ref 70–99)
Potassium: 4.9 mmol/L (ref 3.5–5.2)
Sodium: 142 mmol/L (ref 134–144)
Total Protein: 7.2 g/dL (ref 6.0–8.5)
eGFR: 79 mL/min/{1.73_m2} (ref >59)

## 2023-04-05 LAB — CBC WITH DIFFERENTIAL
Basophils Absolute: 0 10*3/uL (ref 0.0–0.2)
Basos: 0 %
EOS (ABSOLUTE): 0.1 10*3/uL (ref 0.0–0.4)
Eos: 2 %
Hematocrit: 40.7 % (ref 34.0–46.6)
Hemoglobin: 12.4 g/dL (ref 11.1–15.9)
Immature Grans (Abs): 0 10*3/uL (ref 0.0–0.1)
Immature Granulocytes: 0 %
Lymphocytes Absolute: 1.1 10*3/uL (ref 0.7–3.1)
Lymphs: 26 %
MCH: 22.3 pg — ABNORMAL LOW (ref 26.6–33.0)
MCHC: 30.5 g/dL — ABNORMAL LOW (ref 31.5–35.7)
MCV: 73 fL — ABNORMAL LOW (ref 79–97)
Monocytes Absolute: 0.4 10*3/uL (ref 0.1–0.9)
Monocytes: 11 %
Neutrophils Absolute: 2.6 10*3/uL (ref 1.4–7.0)
Neutrophils: 61 %
RBC: 5.55 x10E6/uL — ABNORMAL HIGH (ref 3.77–5.28)
RDW: 14.3 % (ref 11.7–15.4)
WBC: 4.2 10*3/uL (ref 3.4–10.8)

## 2023-04-05 LAB — LIPID PANEL
Chol/HDL Ratio: 2.6 ratio (ref 0.0–4.4)
Cholesterol, Total: 153 mg/dL (ref 100–199)
HDL: 60 mg/dL (ref >39)
LDL Chol Calc (NIH): 78 mg/dL (ref 0–99)
Triglycerides: 75 mg/dL (ref 0–149)
VLDL Cholesterol Cal: 15 mg/dL (ref 5–40)

## 2023-04-05 LAB — HEMOGLOBIN A1C
Est. average glucose Bld gHb Est-mCnc: 148 mg/dL
Hgb A1c MFr Bld: 6.8 % — ABNORMAL HIGH (ref 4.8–5.6)

## 2023-04-07 NOTE — Progress Notes (Signed)
VM left 

## 2023-05-15 ENCOUNTER — Encounter: Payer: Self-pay | Admitting: Internal Medicine

## 2023-05-15 ENCOUNTER — Ambulatory Visit: Payer: 59 | Admitting: Internal Medicine

## 2023-05-15 VITALS — BP 160/70 | HR 87 | Ht 63.0 in | Wt 194.8 lb

## 2023-05-15 DIAGNOSIS — I10 Essential (primary) hypertension: Secondary | ICD-10-CM | POA: Diagnosis not present

## 2023-05-15 DIAGNOSIS — Z96651 Presence of right artificial knee joint: Secondary | ICD-10-CM | POA: Diagnosis not present

## 2023-05-15 DIAGNOSIS — E1165 Type 2 diabetes mellitus with hyperglycemia: Secondary | ICD-10-CM | POA: Diagnosis not present

## 2023-05-15 DIAGNOSIS — E782 Mixed hyperlipidemia: Secondary | ICD-10-CM | POA: Diagnosis not present

## 2023-05-15 LAB — POCT CBG (FASTING - GLUCOSE)-MANUAL ENTRY: Glucose Fasting, POC: 157 mg/dL — AB (ref 70–99)

## 2023-05-15 NOTE — Progress Notes (Signed)
Established Patient Office Visit  Subjective:  Patient ID: Judy Barber, female    DOB: Apr 15, 1956  Age: 67 y.o. MRN: [id number]  Chief Complaint  Patient presents with   Leg Pain    Leg pain from fall    Patient comes in for a follow-up after an urgent care visit on 04/06/2023 when she fell on her buttocks after slipping on a wet ramp.  X-rays were done of the lumbar spine which were clear.  She has some mild residual pain along her right side of the leg.  She is status post right knee replacement surgery has a follow-up with her orthopedic as well. Her blood pressure is high today but she mentions it is due to her right eye discomfort.  Apparently she had a stye but it has resolved. Will send referral to ophthalmology.  Leg Pain     No other concerns at this time.   Past Medical History:  Diagnosis Date   Diabetes mellitus type 2, controlled (HCC)    Heart murmur    History of atrial fibrillation    a.) s/p cardiac ablation 2005; no longer sees cardiology; no interventions for rate control or chronic anticoagulation therapy at this point   History of kidney stones    Hyperlipidemia    Hypertension    Osteoarthritis of both knees    Pneumonia    Pulmonary sarcoidosis (HCC)    Sleep apnea    has never received cpap   Vitamin D deficiency     Past Surgical History:  Procedure Laterality Date   APPENDECTOMY  1980   BREAST BIOPSY Left 2017   benign   BREAST EXCISIONAL BIOPSY Right 2013   benign   CARDIAC ELECTROPHYSIOLOGY MAPPING AND ABLATION  2005   COLONOSCOPY  2019   KNEE ARTHROSCOPY W/ MENISCECTOMY Right 03/01/2017   LUNG BIOPSY  2005   sarcoidosis   PARTIAL KNEE ARTHROPLASTY Left 02/17/2022   Procedure: UNICOMPARTMENTAL KNEE;  Surgeon: Christena Flake, MD;  Location: [facility];  Service: Orthopedics;  Laterality: Left;   STRABISMUS SURGERY Bilateral    as a child   TOTAL KNEE ARTHROPLASTY Right 12/06/2022   Procedure: TOTAL KNEE ARTHROPLASTY;  Surgeon: Christena Flake, MD;  Location: [facility];  Service: Orthopedics;  Laterality: Right;   TUBAL LIGATION      Social History   Socioeconomic History   Marital status: Divorced    Spouse name: Not on file   Number of children: 0   Years of education: Not on file   Highest education level: Not on file  Occupational History   Not on file  Tobacco Use   Smoking status: Former    Current packs/day: 0.00    Average packs/day: 0.5 packs/day for 25.0 years (12.5 ttl pk-yrs)    Types: Cigarettes    Start date: 32    Quit date: 2005    Years since quitting: 19.6   Smokeless tobacco: Never  Vaping Use   Vaping status: Never Used  Substance and Sexual Activity   Alcohol use: Not Currently   Drug use: Never   Sexual activity: Not on file  Other Topics Concern   Not on file  Social History Narrative   Lives with niece   Social Determinants of Health   Financial Resource Strain: Not on file  Food Insecurity: No Food Insecurity (12/06/2022)   Hunger Vital Sign    Worried About Running Out of Food in the Last Year: Never true  Ran Out of Food in the Last Year: Never true  Transportation Needs: No Transportation Needs (12/06/2022)   PRAPARE - Administrator, Civil Service (Medical): No    Lack of Transportation (Non-Medical): No  Physical Activity: Not on file  Stress: Not on file  Social Connections: Not on file  Intimate Partner Violence: Not At Risk (12/06/2022)   Humiliation, Afraid, Rape, and Kick questionnaire    Fear of Current or Ex-Partner: No    Emotionally Abused: No    Physically Abused: No    Sexually Abused: No    Family History  Problem Relation Age of Onset   Heart attack Mother    Stroke Father    Breast cancer Cousin     Allergies  Allergen Reactions   Bee Venom Swelling   Wasp Venom Swelling   Yellow Jacket Venom Swelling    Review of Systems  Constitutional: Negative.  Negative for chills and fever.  HENT: Negative.  Negative for sore throat.    Eyes:  Positive for pain. Negative for discharge and redness.  Respiratory: Negative.  Negative for cough and shortness of breath.   Cardiovascular: Negative.  Negative for chest pain, palpitations and leg swelling.  Gastrointestinal: Negative.  Negative for abdominal pain, constipation, diarrhea, heartburn, nausea and vomiting.  Genitourinary: Negative.  Negative for dysuria and flank pain.  Musculoskeletal: Negative.  Negative for joint pain and myalgias.  Skin: Negative.   Neurological: Negative.  Negative for dizziness and headaches.  Endo/Heme/Allergies: Negative.   Psychiatric/Behavioral: Negative.  Negative for depression and suicidal ideas. The patient is not nervous/anxious.        Objective:   BP (!) 160/70   Pulse 87   Ht 5\' 3"  (1.6 m)   Wt 194 lb 12.8 oz (88.4 kg)   SpO2 95%   BMI 34.51 kg/m   Vitals:   05/15/23 1540  BP: (!) 160/70  Pulse: 87  Height: 5\' 3"  (1.6 m)  Weight: 194 lb 12.8 oz (88.4 kg)  SpO2: 95%  BMI (Calculated): 34.52    Physical Exam Vitals and nursing note reviewed.  Constitutional:      Appearance: Normal appearance.  HENT:     Head: Normocephalic and atraumatic.     Nose: Nose normal.     Mouth/Throat:     Mouth: Mucous membranes are moist.     Pharynx: Oropharynx is clear.  Eyes:     General:        Right eye: No discharge.        Left eye: No discharge.     Conjunctiva/sclera: Conjunctivae normal.  Cardiovascular:     Rate and Rhythm: Normal rate and regular rhythm.     Pulses: Normal pulses.     Heart sounds: Normal heart sounds. No murmur heard. Pulmonary:     Effort: Pulmonary effort is normal.     Breath sounds: Normal breath sounds. No wheezing.  Abdominal:     General: Bowel sounds are normal.     Palpations: Abdomen is soft.     Tenderness: There is no abdominal tenderness. There is no right CVA tenderness or left CVA tenderness.  Musculoskeletal:        General: Normal range of motion.     Cervical back: Normal  range of motion.     Right lower leg: No edema.     Left lower leg: No edema.  Skin:    General: Skin is warm and dry.  Neurological:  General: No focal deficit present.     Mental Status: She is alert and oriented to person, place, and time.  Psychiatric:        Mood and Affect: Mood normal.        Behavior: Behavior normal.      Results for orders placed or performed in visit on 05/15/23  POCT CBG (Fasting - Glucose)  Result Value Ref Range   Glucose Fasting, POC 157 (A) 70 - 99 mg/dL    Recent Results (from the past 2160 hour(s))  POCT CBG (Fasting - Glucose)     Status: None   Collection Time: 04/04/23  2:03 PM  Result Value Ref Range   Glucose Fasting, POC 90 70 - 99 mg/dL  CBC With Differential     Status: Abnormal   Collection Time: 04/04/23  2:26 PM  Result Value Ref Range   WBC 4.2 3.4 - 10.8 x10E3/uL   RBC 5.55 (H) 3.77 - 5.28 x10E6/uL   Hemoglobin 12.4 11.1 - 15.9 g/dL   Hematocrit 16.1 09.6 - 46.6 %   MCV 73 (L) 79 - 97 fL   MCH 22.3 (L) 26.6 - 33.0 pg   MCHC 30.5 (L) 31.5 - 35.7 g/dL   RDW 04.5 40.9 - 81.1 %   Neutrophils 61 Not Estab. %   Lymphs 26 Not Estab. %   Monocytes 11 Not Estab. %   Eos 2 Not Estab. %   Basos 0 Not Estab. %   Neutrophils Absolute 2.6 1.4 - 7.0 x10E3/uL   Lymphocytes Absolute 1.1 0.7 - 3.1 x10E3/uL   Monocytes Absolute 0.4 0.1 - 0.9 x10E3/uL   EOS (ABSOLUTE) 0.1 0.0 - 0.4 x10E3/uL   Basophils Absolute 0.0 0.0 - 0.2 x10E3/uL   Immature Granulocytes 0 Not Estab. %   Immature Grans (Abs) 0.0 0.0 - 0.1 x10E3/uL    Comment: **Effective May 08, 2023, profile [id number] CBC/Differential**   (No Platelet) will be made non-orderable. [facility] Offers:   [id number] CBC With Differential/Platelet   CMP14+EGFR     Status: Abnormal   Collection Time: 04/04/23  2:26 PM  Result Value Ref Range   Glucose 119 (H) 70 - 99 mg/dL   BUN 13 8 - 27 mg/dL   Creatinine, Ser 9.56 0.57 - 1.00 mg/dL   eGFR 79 >21 HY/QMV/7.84   BUN/Creatinine Ratio 16 12  - 28   Sodium 142 134 - 144 mmol/L   Potassium 4.9 3.5 - 5.2 mmol/L   Chloride 104 96 - 106 mmol/L   CO2 25 20 - 29 mmol/L   Calcium 9.6 8.7 - 10.3 mg/dL   Total Protein 7.2 6.0 - 8.5 g/dL   Albumin 4.3 3.9 - 4.9 g/dL   Globulin, Total 2.9 1.5 - 4.5 g/dL   Bilirubin Total 0.3 0.0 - 1.2 mg/dL   Alkaline Phosphatase 98 44 - 121 IU/L   AST 15 0 - 40 IU/L   ALT 10 0 - 32 IU/L  Lipid Profile     Status: None   Collection Time: 04/04/23  2:26 PM  Result Value Ref Range   Cholesterol, Total 153 100 - 199 mg/dL   Triglycerides 75 0 - 149 mg/dL   HDL 60 >69 mg/dL   VLDL Cholesterol Cal 15 5 - 40 mg/dL   LDL Chol Calc (NIH) 78 0 - 99 mg/dL   Chol/HDL Ratio 2.6 0.0 - 4.4 ratio    Comment:  T. Chol/HDL Ratio                                             Men  Women                               1/2 Avg.Risk  3.4    3.3                                   Avg.Risk  5.0    4.4                                2X Avg.Risk  9.6    7.1                                3X Avg.Risk 23.4   11.0   HgB A1c     Status: Abnormal   Collection Time: 04/04/23  2:26 PM  Result Value Ref Range   Hgb A1c MFr Bld 6.8 (H) 4.8 - 5.6 %    Comment:          Prediabetes: 5.7 - 6.4          Diabetes: >6.4          Glycemic control for adults with diabetes: <7.0    Est. average glucose Bld gHb Est-mCnc 148 mg/dL  POCT CBG (Fasting - Glucose)     Status: Abnormal   Collection Time: 05/15/23  3:46 PM  Result Value Ref Range   Glucose Fasting, POC 157 (A) 70 - 99 mg/dL      Assessment & Plan:  Set up ophthalmology referral.  Patient will see her orthopedic for leg pain. Problem List Items Addressed This Visit     Status post total knee replacement using cement, right   Essential hypertension, benign   Type 2 diabetes mellitus with hyperglycemia, without long-term current use of insulin (HCC) - Primary   Relevant Orders   POCT CBG (Fasting - Glucose) (Completed)   Ambulatory  referral to Ophthalmology   Mixed hyperlipidemia    Follow up as scheduled.  Total time spent: 30 minutes  Margaretann Loveless, MD  05/15/2023   This document may have been prepared by [facility] Voice Recognition software and as such may include unintentional dictation errors.

## 2023-05-23 ENCOUNTER — Encounter: Payer: Self-pay | Admitting: Internal Medicine

## 2023-06-29 ENCOUNTER — Other Ambulatory Visit: Payer: Self-pay | Admitting: Internal Medicine

## 2023-06-29 DIAGNOSIS — K219 Gastro-esophageal reflux disease without esophagitis: Secondary | ICD-10-CM

## 2023-08-04 ENCOUNTER — Ambulatory Visit: Payer: 59 | Admitting: Cardiology

## 2023-11-05 ENCOUNTER — Other Ambulatory Visit: Payer: Self-pay | Admitting: Internal Medicine

## 2023-11-13 ENCOUNTER — Telehealth: Payer: Self-pay | Admitting: Internal Medicine

## 2023-11-13 NOTE — Telephone Encounter (Signed)
Patient left message with answering service that she needs her meds called in. Didn't state which meds.

## 2024-01-29 ENCOUNTER — Other Ambulatory Visit: Payer: Self-pay | Admitting: Internal Medicine

## 2024-01-29 DIAGNOSIS — E782 Mixed hyperlipidemia: Secondary | ICD-10-CM

## 2024-01-29 DIAGNOSIS — I10 Essential (primary) hypertension: Secondary | ICD-10-CM

## 2024-01-31 ENCOUNTER — Other Ambulatory Visit: Payer: Self-pay | Admitting: Internal Medicine

## 2024-01-31 DIAGNOSIS — K219 Gastro-esophageal reflux disease without esophagitis: Secondary | ICD-10-CM

## 2024-02-20 ENCOUNTER — Other Ambulatory Visit: Payer: Self-pay | Admitting: Internal Medicine

## 2024-02-20 DIAGNOSIS — Z1231 Encounter for screening mammogram for malignant neoplasm of breast: Secondary | ICD-10-CM

## 2024-02-22 ENCOUNTER — Other Ambulatory Visit: Payer: Self-pay

## 2024-02-22 DIAGNOSIS — E1165 Type 2 diabetes mellitus with hyperglycemia: Secondary | ICD-10-CM

## 2024-02-22 MED ORDER — DAPAGLIFLOZIN PROPANEDIOL 10 MG PO TABS: 10.0000 mg | ORAL_TABLET | Freq: Every day | ORAL | 3 refills | Status: AC

## 2024-02-26 ENCOUNTER — Ambulatory Visit: Admitting: Internal Medicine

## 2024-02-29 ENCOUNTER — Ambulatory Visit: Payer: Self-pay | Admitting: Internal Medicine

## 2024-02-29 ENCOUNTER — Encounter: Payer: Self-pay | Admitting: Internal Medicine

## 2024-02-29 ENCOUNTER — Other Ambulatory Visit: Payer: Self-pay | Admitting: Internal Medicine

## 2024-02-29 ENCOUNTER — Ambulatory Visit (INDEPENDENT_AMBULATORY_CARE_PROVIDER_SITE_OTHER): Admitting: Internal Medicine

## 2024-02-29 VITALS — BP 150/64 | HR 72 | Ht 63.0 in | Wt 198.2 lb

## 2024-02-29 DIAGNOSIS — I10 Essential (primary) hypertension: Secondary | ICD-10-CM | POA: Diagnosis not present

## 2024-02-29 DIAGNOSIS — E782 Mixed hyperlipidemia: Secondary | ICD-10-CM

## 2024-02-29 DIAGNOSIS — M1A4710 Other secondary chronic gout, right ankle and foot, without tophus (tophi): Secondary | ICD-10-CM | POA: Diagnosis not present

## 2024-02-29 DIAGNOSIS — R5383 Other fatigue: Secondary | ICD-10-CM

## 2024-02-29 DIAGNOSIS — R002 Palpitations: Secondary | ICD-10-CM

## 2024-02-29 DIAGNOSIS — E559 Vitamin D deficiency, unspecified: Secondary | ICD-10-CM

## 2024-02-29 DIAGNOSIS — E1165 Type 2 diabetes mellitus with hyperglycemia: Secondary | ICD-10-CM | POA: Diagnosis not present

## 2024-02-29 DIAGNOSIS — E0590 Thyrotoxicosis, unspecified without thyrotoxic crisis or storm: Secondary | ICD-10-CM

## 2024-02-29 DIAGNOSIS — D860 Sarcoidosis of lung: Secondary | ICD-10-CM

## 2024-02-29 LAB — POC CREATINE & ALBUMIN,URINE
Albumin/Creatinine Ratio, Urine, POC: <30
Creatinine, POC: 100 mg/dL
Microalbumin Ur, POC: 10 mg/L

## 2024-02-29 LAB — POCT CBG (FASTING - GLUCOSE)-MANUAL ENTRY: Glucose Fasting, POC: 117 mg/dL — AB (ref 70–99)

## 2024-02-29 MED ORDER — LOSARTAN POTASSIUM 50 MG PO TABS: 50.0000 mg | ORAL_TABLET | Freq: Every day | ORAL | 3 refills | Status: AC

## 2024-02-29 NOTE — Progress Notes (Signed)
 Established Patient Office Visit  Subjective:  Patient ID: Judy Barber, female    DOB: Nov 10, 1955  Age: 68 y.o. MRN: [id number]  Chief Complaint  Patient presents with   Follow-up    Patient not in since June 2024.  She missed her appointments as she was busy getting physical therapy status post right knee replacement.  Says she is feeling a little bit better and has been working at her job.  However reports of getting a feeling the palpitations, and breaks out in a sweat.  Her EKG is relatively unremarkable but will schedule an echocardiogram. She stopped taking her metformin  as it caused diarrhea but is still taking Farxiga  and glimepiride .  She is due for lab work today.  Denies headaches or dizziness, no nausea or vomiting.    No other concerns at this time.   Past Medical History:  Diagnosis Date   Diabetes mellitus type 2, controlled (HCC)    Heart murmur    History of atrial fibrillation    a.) s/p cardiac ablation 2005; no longer sees cardiology; no interventions for rate control or chronic anticoagulation therapy at this point   History of kidney stones    Hyperlipidemia    Hypertension    Osteoarthritis of both knees    Pneumonia    Pulmonary sarcoidosis (HCC)    Sleep apnea    has never received cpap   Vitamin D  deficiency     Past Surgical History:  Procedure Laterality Date   APPENDECTOMY  1980   BREAST BIOPSY Left 2017   benign   BREAST EXCISIONAL BIOPSY Right 2013   benign   CARDIAC ELECTROPHYSIOLOGY MAPPING AND ABLATION  2005   COLONOSCOPY  2019   KNEE ARTHROSCOPY W/ MENISCECTOMY Right 03/01/2017   LUNG BIOPSY  2005   sarcoidosis   PARTIAL KNEE ARTHROPLASTY Left 02/17/2022   Procedure: UNICOMPARTMENTAL KNEE;  Surgeon: Elner Hahn, MD;  Location: [facility];  Service: Orthopedics;  Laterality: Left;   STRABISMUS SURGERY Bilateral    as a child   TOTAL KNEE ARTHROPLASTY Right 12/06/2022   Procedure: TOTAL KNEE ARTHROPLASTY;  Surgeon: Elner Hahn,  MD;  Location: [facility];  Service: Orthopedics;  Laterality: Right;   TUBAL LIGATION      Social History   Socioeconomic History   Marital status: Divorced    Spouse name: Not on file   Number of children: 0   Years of education: Not on file   Highest education level: Not on file  Occupational History   Not on file  Tobacco Use   Smoking status: Former    Current packs/day: 0.00    Average packs/day: 0.5 packs/day for 25.0 years (12.5 ttl pk-yrs)    Types: Cigarettes    Start date: 46    Quit date: 2005    Years since quitting: 20.4   Smokeless tobacco: Never  Vaping Use   Vaping status: Never Used  Substance and Sexual Activity   Alcohol use: Not Currently   Drug use: Never   Sexual activity: Not on file  Other Topics Concern   Not on file  Social History Narrative   Lives with niece   Social Drivers of Health   Financial Resource Strain: Not on file  Food Insecurity: No Food Insecurity (12/06/2022)   Hunger Vital Sign    Worried About Running Out of Food in the Last Year: Never true    Ran Out of Food in the Last Year: Never true  Transportation  Needs: No Transportation Needs (12/06/2022)   PRAPARE - Administrator, Civil Service (Medical): No    Lack of Transportation (Non-Medical): No  Physical Activity: Not on file  Stress: Not on file  Social Connections: Not on file  Intimate Partner Violence: Not At Risk (12/06/2022)   Humiliation, Afraid, Rape, and Kick questionnaire    Fear of Current or Ex-Partner: No    Emotionally Abused: No    Physically Abused: No    Sexually Abused: No    Family History  Problem Relation Age of Onset   Heart attack Mother    Stroke Father    Breast cancer Cousin     Allergies  Allergen Reactions   Bee Venom Swelling   Wasp Venom Swelling   Yellow Jacket Venom Swelling    Outpatient Medications Prior to Visit  Medication Sig   acetaminophen  (TYLENOL ) 500 MG tablet Take 2 tablets (1,000 mg total) by mouth  every 6 (six) hours.   alendronate (FOSAMAX) 70 MG tablet Take 70 mg by mouth once a week. Take with a full glass of water on an empty stomach. Sundays   aspirin EC 81 MG tablet Take 81 mg by mouth daily. Swallow whole.   cholecalciferol  (VITAMIN D3) 25 MCG (1000 UNIT) tablet Take 1,000 Units by mouth daily.   dapagliflozin  propanediol (FARXIGA ) 10 MG TABS tablet Take 1 tablet (10 mg total) by mouth daily before breakfast.   glimepiride  (AMARYL ) 4 MG tablet Take 1 tablet by mouth twice daily   meloxicam  (MOBIC ) 15 MG tablet Take 1 tablet (15 mg total) by mouth daily.   pantoprazole  (PROTONIX ) 40 MG tablet Take 1 tablet by mouth once daily   [DISCONTINUED] losartan  (COZAAR ) 25 MG tablet Take 1 tablet by mouth once daily   [DISCONTINUED] simvastatin  (ZOCOR ) 20 MG tablet Take 1 tablet by mouth once daily   colchicine  0.6 MG tablet Take 1 tablet (0.6 mg total) by mouth 2 (two) times daily. (Patient not taking: Reported on 02/29/2024)   furosemide  (LASIX ) 20 MG tablet Take 1 tablet by mouth once daily (Patient not taking: Reported on 02/29/2024)   metFORMIN  (GLUCOPHAGE ) 850 MG tablet Take 1 tablet (850 mg total) by mouth 2 (two) times daily with a meal. (Patient not taking: Reported on 02/29/2024)   [DISCONTINUED] apixaban  (ELIQUIS ) 2.5 MG TABS tablet Take 1 tablet (2.5 mg total) by mouth 2 (two) times daily. (Patient not taking: Reported on 02/29/2024)   [DISCONTINUED] celecoxib (CELEBREX) 200 MG capsule Take 200 mg by mouth daily. (Patient not taking: Reported on 02/29/2024)   [DISCONTINUED] ondansetron  (ZOFRAN ) 4 MG tablet Take 1 tablet (4 mg total) by mouth every 6 (six) hours as needed for nausea. (Patient not taking: Reported on 02/29/2024)   [DISCONTINUED] oxyCODONE  (OXY IR/ROXICODONE ) 5 MG immediate release tablet Take 1-2 tablets (5-10 mg total) by mouth every 4 (four) hours as needed for moderate pain (pain score 4-6). (Patient not taking: Reported on 02/29/2024)   No facility-administered  medications prior to visit.    Review of Systems  Constitutional: Negative.  Negative for chills, fever, malaise/fatigue and weight loss.  HENT: Negative.  Negative for sore throat.   Eyes: Negative.   Respiratory: Negative.  Negative for cough and shortness of breath.   Cardiovascular: Negative.  Negative for chest pain, palpitations and leg swelling.  Gastrointestinal: Negative.  Negative for abdominal pain, constipation, diarrhea, heartburn, nausea and vomiting.  Genitourinary: Negative.  Negative for dysuria and flank pain.  Musculoskeletal: Negative.  Negative for  joint pain and myalgias.  Skin: Negative.   Neurological: Negative.  Negative for dizziness, tingling, tremors, sensory change and headaches.  Endo/Heme/Allergies: Negative.   Psychiatric/Behavioral: Negative.  Negative for depression and suicidal ideas. The patient is not nervous/anxious.        Objective:   BP (!) 150/64   Pulse 72   Ht 5\' 3"  (1.6 m)   Wt 198 lb 3.2 oz (89.9 kg)   SpO2 99%   BMI 35.11 kg/m   Vitals:   02/29/24 1007 02/29/24 1114  BP: (!) 154/64 (!) 150/64  Pulse: 72   Height: 5\' 3"  (1.6 m)   Weight: 198 lb 3.2 oz (89.9 kg)   SpO2: 99%   BMI (Calculated): 35.12     Physical Exam Vitals and nursing note reviewed.  Constitutional:      Appearance: Normal appearance.  HENT:     Head: Normocephalic and atraumatic.     Nose: Nose normal.     Mouth/Throat:     Mouth: Mucous membranes are moist.     Pharynx: Oropharynx is clear.  Eyes:     Conjunctiva/sclera: Conjunctivae normal.     Pupils: Pupils are equal, round, and reactive to light.  Cardiovascular:     Rate and Rhythm: Normal rate and regular rhythm.     Pulses: Normal pulses.     Heart sounds: Normal heart sounds. No murmur heard. Pulmonary:     Effort: Pulmonary effort is normal.     Breath sounds: Normal breath sounds. No wheezing.  Abdominal:     General: Bowel sounds are normal.     Palpations: Abdomen is soft.      Tenderness: There is no abdominal tenderness. There is no right CVA tenderness or left CVA tenderness.  Musculoskeletal:        General: Normal range of motion.     Cervical back: Normal range of motion.     Right lower leg: No edema.     Left lower leg: No edema.  Skin:    General: Skin is warm and dry.  Neurological:     General: No focal deficit present.     Mental Status: She is alert and oriented to person, place, and time.  Psychiatric:        Mood and Affect: Mood normal.        Behavior: Behavior normal.      Results for orders placed or performed in visit on 02/29/24  POCT CBG (Fasting - Glucose)  Result Value Ref Range   Glucose Fasting, POC 117 (A) 70 - 99 mg/dL  POC CREATINE & ALBUMIN,URINE  Result Value Ref Range   Microalbumin Ur, POC 10 mg/L   Creatinine, POC 100 mg/dL   Albumin/Creatinine Ratio, Urine, POC <30     Recent Results (from the past 2160 hours)  POCT CBG (Fasting - Glucose)     Status: Abnormal   Collection Time: 02/29/24 10:12 AM  Result Value Ref Range   Glucose Fasting, POC 117 (A) 70 - 99 mg/dL  POC CREATINE & ALBUMIN,URINE     Status: Normal   Collection Time: 02/29/24 11:13 AM  Result Value Ref Range   Microalbumin Ur, POC 10 mg/L   Creatinine, POC 100 mg/dL   Albumin/Creatinine Ratio, Urine, POC <30       Assessment & Plan:  Increase dose of losartan  to 50 mg.  Continue rest of medications.  Check labs today.  Schedule echocardiogram. Problem List Items Addressed This Visit  Essential hypertension, benign - Primary   Relevant Medications   losartan  (COZAAR ) 50 MG tablet   Other Relevant Orders   CMP14+EGFR   Sarcoidosis of lung (HCC)   Type 2 diabetes mellitus with hyperglycemia, without long-term current use of insulin  (HCC)   Relevant Medications   losartan  (COZAAR ) 50 MG tablet   Other Relevant Orders   POCT CBG (Fasting - Glucose) (Completed)   CMP14+EGFR   HgB A1c   POC CREATINE & ALBUMIN,URINE (Completed)    Vitamin D  deficiency   Relevant Orders   Vitamin D  (25 hydroxy)   Gout   Relevant Orders   Uric acid   Mixed hyperlipidemia   Relevant Medications   losartan  (COZAAR ) 50 MG tablet   Other Relevant Orders   CMP14+EGFR   Lipid Profile   Other Visit Diagnoses       Palpitations       Relevant Orders   CBC with Diff   Magnesium    TSH   EKG 12-Lead   PCV ECHOCARDIOGRAM COMPLETE     Other fatigue           Return in about 2 weeks (around 03/14/2024).   Total time spent: 30 minutes  Aisha Hove, MD  02/29/2024   This document may have been prepared by [facility] Voice Recognition software and as such may include unintentional dictation errors.

## 2024-03-01 ENCOUNTER — Other Ambulatory Visit: Payer: Self-pay

## 2024-03-01 DIAGNOSIS — E1165 Type 2 diabetes mellitus with hyperglycemia: Secondary | ICD-10-CM

## 2024-03-01 DIAGNOSIS — E0590 Thyrotoxicosis, unspecified without thyrotoxic crisis or storm: Secondary | ICD-10-CM

## 2024-03-01 LAB — CMP14+EGFR
ALT: 10 IU/L (ref 0–32)
AST: 18 IU/L (ref 0–40)
Albumin: 4.1 g/dL (ref 3.9–4.9)
Alkaline Phosphatase: 103 IU/L (ref 44–121)
BUN/Creatinine Ratio: 20 (ref 12–28)
BUN: 14 mg/dL (ref 8–27)
Bilirubin Total: 0.3 mg/dL (ref 0.0–1.2)
CO2: 23 mmol/L (ref 20–29)
Calcium: 9.6 mg/dL (ref 8.7–10.3)
Chloride: 107 mmol/L — ABNORMAL HIGH (ref 96–106)
Creatinine, Ser: 0.7 mg/dL (ref 0.57–1.00)
Globulin, Total: 2.9 g/dL (ref 1.5–4.5)
Glucose: 105 mg/dL — ABNORMAL HIGH (ref 70–99)
Potassium: 4.4 mmol/L (ref 3.5–5.2)
Sodium: 142 mmol/L (ref 134–144)
Total Protein: 7 g/dL (ref 6.0–8.5)
eGFR: 95 mL/min/{1.73_m2} (ref >59)

## 2024-03-01 LAB — CBC WITH DIFFERENTIAL/PLATELET
Basophils Absolute: 0 10*3/uL (ref 0.0–0.2)
Basos: 0 %
EOS (ABSOLUTE): 0.2 10*3/uL (ref 0.0–0.4)
Eos: 3 %
Hematocrit: 40.9 % (ref 34.0–46.6)
Hemoglobin: 12.1 g/dL (ref 11.1–15.9)
Immature Grans (Abs): 0 10*3/uL (ref 0.0–0.1)
Immature Granulocytes: 0 %
Lymphocytes Absolute: 1.3 10*3/uL (ref 0.7–3.1)
Lymphs: 26 %
MCH: 22.2 pg — ABNORMAL LOW (ref 26.6–33.0)
MCHC: 29.6 g/dL — ABNORMAL LOW (ref 31.5–35.7)
MCV: 75 fL — ABNORMAL LOW (ref 79–97)
Monocytes Absolute: 0.5 10*3/uL (ref 0.1–0.9)
Monocytes: 10 %
Neutrophils Absolute: 3 10*3/uL (ref 1.4–7.0)
Neutrophils: 61 %
Platelets: 352 10*3/uL (ref 150–450)
RBC: 5.44 x10E6/uL — ABNORMAL HIGH (ref 3.77–5.28)
RDW: 14.1 % (ref 11.7–15.4)
WBC: 4.9 10*3/uL (ref 3.4–10.8)

## 2024-03-01 LAB — HEMOGLOBIN A1C
Est. average glucose Bld gHb Est-mCnc: 157 mg/dL
Hgb A1c MFr Bld: 7.1 % — ABNORMAL HIGH (ref 4.8–5.6)

## 2024-03-01 LAB — TSH: TSH: <0.005 u[IU]/mL — ABNORMAL LOW (ref 0.450–4.500)

## 2024-03-01 LAB — LIPID PANEL
Chol/HDL Ratio: 2.5 ratio (ref 0.0–4.4)
Cholesterol, Total: 136 mg/dL (ref 100–199)
HDL: 55 mg/dL (ref >39)
LDL Chol Calc (NIH): 67 mg/dL (ref 0–99)
Triglycerides: 68 mg/dL (ref 0–149)
VLDL Cholesterol Cal: 14 mg/dL (ref 5–40)

## 2024-03-01 LAB — VITAMIN D 25 HYDROXY (VIT D DEFICIENCY, FRACTURES): Vit D, 25-Hydroxy: 32.7 ng/mL (ref 30.0–100.0)

## 2024-03-01 LAB — MAGNESIUM: Magnesium: 2 mg/dL (ref 1.6–2.3)

## 2024-03-01 LAB — URIC ACID: Uric Acid: 3.6 mg/dL (ref 3.0–7.2)

## 2024-03-01 NOTE — Progress Notes (Signed)
 Patient notified

## 2024-03-02 LAB — SPECIMEN STATUS REPORT

## 2024-03-02 LAB — TSH+T4F+T3FREE
Free T4: 2.62 ng/dL — ABNORMAL HIGH (ref 0.82–1.77)
T3, Free: 9.1 pg/mL — ABNORMAL HIGH (ref 2.0–4.4)
TSH: <0.005 u[IU]/mL — ABNORMAL LOW (ref 0.450–4.500)

## 2024-03-04 NOTE — Progress Notes (Signed)
 Patient notified

## 2024-03-05 ENCOUNTER — Other Ambulatory Visit

## 2024-03-06 LAB — THYROID ANTIBODIES (THYROPEROXIDASE & THYROGLOBULIN)
Thyroglobulin Antibody: <1 [IU]/mL (ref 0.0–0.9)
Thyroperoxidase Ab SerPl-aCnc: 53 [IU]/mL — ABNORMAL HIGH (ref 0–34)

## 2024-03-06 LAB — ANTI-TPO AB (RDL)

## 2024-03-06 NOTE — Addendum Note (Signed)
 Addended by: Evander Hills on: 03/06/2024 09:26 AM   Modules accepted: Orders

## 2024-03-08 ENCOUNTER — Ambulatory Visit
Admission: RE | Admit: 2024-03-08 | Discharge: 2024-03-08 | Disposition: A | Discharge status: Home / Self Care | Source: Ambulatory Visit | Attending: Internal Medicine | Admitting: Internal Medicine

## 2024-03-08 DIAGNOSIS — E0590 Thyrotoxicosis, unspecified without thyrotoxic crisis or storm: Secondary | ICD-10-CM | POA: Diagnosis present

## 2024-03-14 ENCOUNTER — Ambulatory Visit: Admitting: Internal Medicine

## 2024-03-15 ENCOUNTER — Ambulatory Visit

## 2024-03-15 DIAGNOSIS — I340 Nonrheumatic mitral (valve) insufficiency: Secondary | ICD-10-CM | POA: Diagnosis not present

## 2024-03-15 DIAGNOSIS — I361 Nonrheumatic tricuspid (valve) insufficiency: Secondary | ICD-10-CM

## 2024-03-15 DIAGNOSIS — R002 Palpitations: Secondary | ICD-10-CM

## 2024-03-18 ENCOUNTER — Ambulatory Visit: Payer: Self-pay | Admitting: Internal Medicine

## 2024-03-18 ENCOUNTER — Ambulatory Visit (INDEPENDENT_AMBULATORY_CARE_PROVIDER_SITE_OTHER): Admitting: Internal Medicine

## 2024-03-18 ENCOUNTER — Encounter: Payer: Self-pay | Admitting: Internal Medicine

## 2024-03-18 VITALS — BP 136/62 | HR 87 | Ht 63.0 in | Wt 198.8 lb

## 2024-03-18 DIAGNOSIS — I10 Essential (primary) hypertension: Secondary | ICD-10-CM | POA: Diagnosis not present

## 2024-03-18 DIAGNOSIS — E0590 Thyrotoxicosis, unspecified without thyrotoxic crisis or storm: Secondary | ICD-10-CM | POA: Diagnosis not present

## 2024-03-18 DIAGNOSIS — E041 Nontoxic single thyroid nodule: Secondary | ICD-10-CM

## 2024-03-18 DIAGNOSIS — E1165 Type 2 diabetes mellitus with hyperglycemia: Secondary | ICD-10-CM

## 2024-03-18 DIAGNOSIS — E782 Mixed hyperlipidemia: Secondary | ICD-10-CM

## 2024-03-18 LAB — POCT CBG (FASTING - GLUCOSE)-MANUAL ENTRY: Glucose Fasting, POC: 146 mg/dL — AB (ref 70–99)

## 2024-03-18 NOTE — Progress Notes (Signed)
 Established Patient Office Visit  Subjective:  Patient ID: Judy Barber, female    DOB: September 13, 1956  Age: 68 y.o. MRN: [id number]  Chief Complaint  Patient presents with   Follow-up    2 week follow up    Patient comes in for her follow-up today.  She is actually feeling better.  However her recent labs had shown hyperthyroidism.  She has a history of thyroid  nodules and was under care of endocrinologist until released few years back.  Repeat ultrasound of the thyroid  shows at least 1 nodule which needs to be biopsied.  She also has a radioactive iodine uptake scan scheduled for tomorrow.  Will set up a endocrinology referral for her. Denies headaches or dizziness, no nausea or vomiting, no palpitations.  Her blood pressure looks much better now.    No other concerns at this time.   Past Medical History:  Diagnosis Date   Diabetes mellitus type 2, controlled (HCC)    Heart murmur    History of atrial fibrillation    a.) s/p cardiac ablation 2005; no longer sees cardiology; no interventions for rate control or chronic anticoagulation therapy at this point   History of kidney stones    Hyperlipidemia    Hypertension    Osteoarthritis of both knees    Pneumonia    Pulmonary sarcoidosis (HCC)    Sleep apnea    has never received cpap   Vitamin D  deficiency     Past Surgical History:  Procedure Laterality Date   APPENDECTOMY  1980   BREAST BIOPSY Left 2017   benign   BREAST EXCISIONAL BIOPSY Right 2013   benign   CARDIAC ELECTROPHYSIOLOGY MAPPING AND ABLATION  2005   COLONOSCOPY  2019   KNEE ARTHROSCOPY W/ MENISCECTOMY Right 03/01/2017   LUNG BIOPSY  2005   sarcoidosis   PARTIAL KNEE ARTHROPLASTY Left 02/17/2022   Procedure: UNICOMPARTMENTAL KNEE;  Surgeon: Elner Hahn, MD;  Location: [facility];  Service: Orthopedics;  Laterality: Left;   STRABISMUS SURGERY Bilateral    as a child   TOTAL KNEE ARTHROPLASTY Right 12/06/2022   Procedure: TOTAL KNEE ARTHROPLASTY;   Surgeon: Elner Hahn, MD;  Location: [facility];  Service: Orthopedics;  Laterality: Right;   TUBAL LIGATION      Social History   Socioeconomic History   Marital status: Divorced    Spouse name: Not on file   Number of children: 0   Years of education: Not on file   Highest education level: Not on file  Occupational History   Not on file  Tobacco Use   Smoking status: Former    Current packs/day: 0.00    Average packs/day: 0.5 packs/day for 25.0 years (12.5 ttl pk-yrs)    Types: Cigarettes    Start date: 73    Quit date: 2005    Years since quitting: 20.4   Smokeless tobacco: Never  Vaping Use   Vaping status: Never Used  Substance and Sexual Activity   Alcohol use: Not Currently   Drug use: Never   Sexual activity: Not on file  Other Topics Concern   Not on file  Social History Narrative   Lives with niece   Social Drivers of Health   Financial Resource Strain: Not on file  Food Insecurity: No Food Insecurity (12/06/2022)   Hunger Vital Sign    Worried About Running Out of Food in the Last Year: Never true    Ran Out of Food in the Last Year:  Never true  Transportation Needs: No Transportation Needs (12/06/2022)   PRAPARE - Administrator, Civil Service (Medical): No    Lack of Transportation (Non-Medical): No  Physical Activity: Not on file  Stress: Not on file  Social Connections: Not on file  Intimate Partner Violence: Not At Risk (12/06/2022)   Humiliation, Afraid, Rape, and Kick questionnaire    Fear of Current or Ex-Partner: No    Emotionally Abused: No    Physically Abused: No    Sexually Abused: No    Family History  Problem Relation Age of Onset   Heart attack Mother    Stroke Father    Breast cancer Cousin     Allergies  Allergen Reactions   Bee Venom Swelling   Wasp Venom Swelling   Yellow Jacket Venom Swelling    Outpatient Medications Prior to Visit  Medication Sig   acetaminophen  (TYLENOL ) 500 MG tablet Take 2 tablets  (1,000 mg total) by mouth every 6 (six) hours.   alendronate (FOSAMAX) 70 MG tablet Take 70 mg by mouth once a week. Take with a full glass of water on an empty stomach. Sundays   aspirin EC 81 MG tablet Take 81 mg by mouth daily. Swallow whole.   azaTHIOprine (IMURAN) 50 MG tablet Take 50 mg by mouth daily.   cholecalciferol  (VITAMIN D3) 25 MCG (1000 UNIT) tablet Take 1,000 Units by mouth daily.   dapagliflozin  propanediol (FARXIGA ) 10 MG TABS tablet Take 1 tablet (10 mg total) by mouth daily before breakfast.   furosemide  (LASIX ) 20 MG tablet Take 1 tablet by mouth once daily   glimepiride  (AMARYL ) 4 MG tablet Take 1 tablet by mouth twice daily   losartan  (COZAAR ) 50 MG tablet Take 1 tablet (50 mg total) by mouth daily.   meloxicam  (MOBIC ) 15 MG tablet Take 1 tablet (15 mg total) by mouth daily.   pantoprazole  (PROTONIX ) 40 MG tablet Take 1 tablet by mouth once daily   simvastatin  (ZOCOR ) 20 MG tablet Take 1 tablet by mouth once daily   [DISCONTINUED] metFORMIN  (GLUCOPHAGE ) 850 MG tablet Take 1 tablet (850 mg total) by mouth 2 (two) times daily with a meal.   colchicine  0.6 MG tablet Take 1 tablet (0.6 mg total) by mouth 2 (two) times daily. (Patient not taking: Reported on 03/18/2024)   No facility-administered medications prior to visit.    Review of Systems  Constitutional: Negative.  Negative for chills, diaphoresis, fever, malaise/fatigue and weight loss.  HENT: Negative.  Negative for sore throat.   Eyes: Negative.   Respiratory: Negative.  Negative for cough and shortness of breath.   Cardiovascular: Negative.  Negative for chest pain, palpitations and leg swelling.  Gastrointestinal: Negative.  Negative for abdominal pain, constipation, diarrhea, heartburn, nausea and vomiting.  Genitourinary: Negative.  Negative for dysuria and flank pain.  Musculoskeletal: Negative.  Negative for joint pain and myalgias.  Skin: Negative.   Neurological: Negative.  Negative for dizziness,  tingling, tremors, sensory change and headaches.  Endo/Heme/Allergies: Negative.   Psychiatric/Behavioral: Negative.  Negative for depression and suicidal ideas. The patient is not nervous/anxious.        Objective:   BP 136/62   Pulse 87   Ht 5\' 3"  (1.6 m)   Wt 198 lb 12.8 oz (90.2 kg)   SpO2 95%   BMI 35.22 kg/m   Vitals:   03/18/24 1349  BP: 136/62  Pulse: 87  Height: 5\' 3"  (1.6 m)  Weight: 198 lb 12.8 oz (90.2  kg)  SpO2: 95%  BMI (Calculated): 35.22    Physical Exam Vitals and nursing note reviewed.  Constitutional:      Appearance: Normal appearance.  HENT:     Head: Normocephalic and atraumatic.     Nose: Nose normal.     Mouth/Throat:     Mouth: Mucous membranes are moist.     Pharynx: Oropharynx is clear.  Eyes:     Conjunctiva/sclera: Conjunctivae normal.     Pupils: Pupils are equal, round, and reactive to light.  Cardiovascular:     Rate and Rhythm: Normal rate and regular rhythm.     Pulses: Normal pulses.     Heart sounds: Normal heart sounds. No murmur heard. Pulmonary:     Effort: Pulmonary effort is normal.     Breath sounds: Normal breath sounds. No wheezing.  Abdominal:     General: Bowel sounds are normal.     Palpations: Abdomen is soft.     Tenderness: There is no abdominal tenderness. There is no right CVA tenderness or left CVA tenderness.  Musculoskeletal:        General: Normal range of motion.     Cervical back: Normal range of motion.     Right lower leg: No edema.     Left lower leg: No edema.  Skin:    General: Skin is warm and dry.  Neurological:     General: No focal deficit present.     Mental Status: She is alert and oriented to person, place, and time.  Psychiatric:        Mood and Affect: Mood normal.        Behavior: Behavior normal.      Results for orders placed or performed in visit on 03/18/24  POCT CBG (Fasting - Glucose)  Result Value Ref Range   Glucose Fasting, POC 146 (A) 70 - 99 mg/dL    Recent  Results (from the past 2160 hours)  POCT CBG (Fasting - Glucose)     Status: Abnormal   Collection Time: 02/29/24 10:12 AM  Result Value Ref Range   Glucose Fasting, POC 117 (A) 70 - 99 mg/dL  POC CREATINE & ALBUMIN,URINE     Status: Normal   Collection Time: 02/29/24 11:13 AM  Result Value Ref Range   Microalbumin Ur, POC 10 mg/L   Creatinine, POC 100 mg/dL   Albumin/Creatinine Ratio, Urine, POC <30   CMP14+EGFR     Status: Abnormal   Collection Time: 02/29/24 11:29 AM  Result Value Ref Range   Glucose 105 (H) 70 - 99 mg/dL   BUN 14 8 - 27 mg/dL   Creatinine, Ser 9.14 0.57 - 1.00 mg/dL   eGFR 95 >78 GN/FAO/1.30   BUN/Creatinine Ratio 20 12 - 28   Sodium 142 134 - 144 mmol/L   Potassium 4.4 3.5 - 5.2 mmol/L   Chloride 107 (H) 96 - 106 mmol/L   CO2 23 20 - 29 mmol/L   Calcium 9.6 8.7 - 10.3 mg/dL   Total Protein 7.0 6.0 - 8.5 g/dL   Albumin 4.1 3.9 - 4.9 g/dL   Globulin, Total 2.9 1.5 - 4.5 g/dL   Bilirubin Total 0.3 0.0 - 1.2 mg/dL   Alkaline Phosphatase 103 44 - 121 IU/L   AST 18 0 - 40 IU/L   ALT 10 0 - 32 IU/L  Lipid Profile     Status: None   Collection Time: 02/29/24 11:29 AM  Result Value Ref Range   Cholesterol, Total 136  100 - 199 mg/dL   Triglycerides 68 0 - 149 mg/dL   HDL 55 >78 mg/dL   VLDL Cholesterol Cal 14 5 - 40 mg/dL   LDL Chol Calc (NIH) 67 0 - 99 mg/dL   Chol/HDL Ratio 2.5 0.0 - 4.4 ratio    Comment:                                   T. Chol/HDL Ratio                                             Men  Women                               1/2 Avg.Risk  3.4    3.3                                   Avg.Risk  5.0    4.4                                2X Avg.Risk  9.6    7.1                                3X Avg.Risk 23.4   11.0   HgB A1c     Status: Abnormal   Collection Time: 02/29/24 11:29 AM  Result Value Ref Range   Hgb A1c MFr Bld 7.1 (H) 4.8 - 5.6 %    Comment:          Prediabetes: 5.7 - 6.4          Diabetes: >6.4          Glycemic control for  adults with diabetes: <7.0    Est. average glucose Bld gHb Est-mCnc 157 mg/dL  CBC with Diff     Status: Abnormal   Collection Time: 02/29/24 11:29 AM  Result Value Ref Range   WBC 4.9 3.4 - 10.8 x10E3/uL   RBC 5.44 (H) 3.77 - 5.28 x10E6/uL   Hemoglobin 12.1 11.1 - 15.9 g/dL   Hematocrit 46.9 62.9 - 46.6 %   MCV 75 (L) 79 - 97 fL   MCH 22.2 (L) 26.6 - 33.0 pg   MCHC 29.6 (L) 31.5 - 35.7 g/dL   RDW 52.8 41.3 - 24.4 %   Platelets 352 150 - 450 x10E3/uL   Neutrophils 61 Not Estab. %   Lymphs 26 Not Estab. %   Monocytes 10 Not Estab. %   Eos 3 Not Estab. %   Basos 0 Not Estab. %   Neutrophils Absolute 3.0 1.4 - 7.0 x10E3/uL   Lymphocytes Absolute 1.3 0.7 - 3.1 x10E3/uL   Monocytes Absolute 0.5 0.1 - 0.9 x10E3/uL   EOS (ABSOLUTE) 0.2 0.0 - 0.4 x10E3/uL   Basophils Absolute 0.0 0.0 - 0.2 x10E3/uL   Immature Granulocytes 0 Not Estab. %   Immature Grans (Abs) 0.0 0.0 - 0.1 x10E3/uL  Magnesium      Status: None   Collection Time: 02/29/24 11:29 AM  Result Value Ref Range   Magnesium   2.0 1.6 - 2.3 mg/dL  Uric acid     Status: None   Collection Time: 02/29/24 11:29 AM  Result Value Ref Range   Uric Acid 3.6 3.0 - 7.2 mg/dL    Comment:            Therapeutic target for gout patients: <6.0  TSH     Status: Abnormal   Collection Time: 02/29/24 11:29 AM  Result Value Ref Range   TSH <0.005 (L) 0.450 - 4.500 uIU/mL  Vitamin D  (25 hydroxy)     Status: None   Collection Time: 02/29/24 11:29 AM  Result Value Ref Range   Vit D, 25-Hydroxy 32.7 30.0 - 100.0 ng/mL    Comment: Vitamin D  deficiency has been defined by the Institute of Medicine and an Endocrine Society practice guideline as a level of serum 25-OH vitamin D  less than 20 ng/mL (1,2). The Endocrine Society went on to further define vitamin D  insufficiency as a level between 21 and 29 ng/mL (2). 1. IOM (Institute of Medicine). 2010. Dietary reference    intakes for calcium and D. [place]: The    [employer]. 2. Holick MF, Binkley [place], Bischoff-Ferrari HA, et al.    Evaluation, treatment, and prevention of vitamin D     deficiency: an Endocrine Society clinical practice    guideline. JCEM. 2011 Jul; 96(7):1911-30.   GEX+[id number]+[id number]     Status: Abnormal   Collection Time: 02/29/24 11:29 AM  Result Value Ref Range   TSH <0.005 (L) 0.450 - 4.500 uIU/mL   T3, Free 9.1 (H) 2.0 - 4.4 pg/mL   Free T4 2.62 (H) 0.82 - 1.77 ng/dL  Specimen status report     Status: None   Collection Time: 02/29/24 11:29 AM  Result Value Ref Range   specimen status report Comment     Comment: Written Authorization Written Authorization Written Authorization Received. Authorization received from Shizuye Rupert 03-01-2024 Logged by Adell Age   Anti-TPO Ab (RDL)     Status: None   Collection Time: 03/05/24  8:40 AM  Result Value Ref Range   Anti-TPO Ab (RDL) CANCELED IU/mL    Comment: Duplicate procedure ordered.  Result canceled by the ancillary.   Thyroid  antibodies (Thyroperoxidase & Thyroglobulin)     Status: Abnormal   Collection Time: 03/05/24  8:40 AM  Result Value Ref Range   Thyroperoxidase Ab SerPl-aCnc 53 (H) 0 - 34 IU/mL   Thyroglobulin Antibody <1.0 0.0 - 0.9 IU/mL    Comment: Thyroglobulin Antibody measured by [employer] Methodology It should be noted that the presence of thyroglobulin antibodies may not be pathogenic nor diagnostic, especially at very low levels. The assay manufacturer has found that four percent of individuals without evidence of thyroid  disease or autoimmunity will have positive TgAb levels up to 4 IU/mL.   POCT CBG (Fasting - Glucose)     Status: Abnormal   Collection Time: 03/18/24  1:56 PM  Result Value Ref Range   Glucose Fasting, POC 146 (A) 70 - 99 mg/dL      Assessment & Plan:  Continue current medications.  Referral to endocrinology for thyroid  nodule biopsy and management of hypothyroidism. Problem List Items Addressed This Visit     Essential  hypertension, benign - Primary   Type 2 diabetes mellitus with hyperglycemia, without long-term current use of insulin  (HCC)   Relevant Orders   POCT CBG (Fasting - Glucose) (Completed)   Mixed hyperlipidemia   Other Visit Diagnoses  Thyroid  nodule       Relevant Orders   Ambulatory referral to Endocrinology     Hyperthyroidism       Relevant Orders   Ambulatory referral to Endocrinology       Return in about 1 month (around 04/17/2024).   Total time spent: 30 minutes  Aisha Hove, MD  03/18/2024   This document may have been prepared by [facility] Voice Recognition software and as such may include unintentional dictation errors.

## 2024-03-19 ENCOUNTER — Encounter

## 2024-03-19 ENCOUNTER — Ambulatory Visit
Admission: RE | Admit: 2024-03-19 | Discharge: 2024-03-19 | Disposition: A | Discharge status: Home / Self Care | Source: Ambulatory Visit | Attending: Internal Medicine | Admitting: Internal Medicine

## 2024-03-19 ENCOUNTER — Encounter
Admission: RE | Admit: 2024-03-19 | Discharge: 2024-03-19 | Disposition: A | Discharge status: Home / Self Care | Source: Ambulatory Visit | Attending: Family | Admitting: Family

## 2024-03-19 DIAGNOSIS — E0590 Thyrotoxicosis, unspecified without thyrotoxic crisis or storm: Secondary | ICD-10-CM | POA: Insufficient documentation

## 2024-03-19 DIAGNOSIS — Z1231 Encounter for screening mammogram for malignant neoplasm of breast: Secondary | ICD-10-CM | POA: Insufficient documentation

## 2024-03-20 ENCOUNTER — Encounter

## 2024-03-21 ENCOUNTER — Ambulatory Visit: Payer: Self-pay | Admitting: Internal Medicine

## 2024-03-26 ENCOUNTER — Other Ambulatory Visit: Payer: Self-pay

## 2024-03-26 DIAGNOSIS — E1165 Type 2 diabetes mellitus with hyperglycemia: Secondary | ICD-10-CM

## 2024-03-28 ENCOUNTER — Encounter
Admission: RE | Admit: 2024-03-28 | Discharge: 2024-03-28 | Disposition: A | Discharge status: Home / Self Care | Source: Ambulatory Visit | Attending: Family | Admitting: Family

## 2024-03-28 ENCOUNTER — Encounter
Admission: RE | Admit: 2024-03-28 | Discharge: 2024-03-28 | Discharge status: Home / Self Care | Source: Ambulatory Visit | Attending: Family | Admitting: Family

## 2024-03-28 DIAGNOSIS — E0590 Thyrotoxicosis, unspecified without thyrotoxic crisis or storm: Secondary | ICD-10-CM | POA: Diagnosis present

## 2024-03-28 MED ORDER — SODIUM IODIDE I-123 7.4 MBQ CAPS
500.0000 | ORAL_CAPSULE | Freq: Once | ORAL | Status: AC
Start: 2024-03-28 — End: 2024-03-28
  Administered 2024-03-28: 433.7 via ORAL

## 2024-03-29 ENCOUNTER — Encounter
Admission: RE | Admit: 2024-03-29 | Discharge: 2024-03-29 | Disposition: A | Discharge status: Home / Self Care | Source: Ambulatory Visit | Attending: Family | Admitting: Family

## 2024-04-01 ENCOUNTER — Ambulatory Visit: Payer: Self-pay | Admitting: Internal Medicine

## 2024-04-03 ENCOUNTER — Telehealth: Payer: Self-pay | Admitting: Internal Medicine

## 2024-04-03 NOTE — Telephone Encounter (Signed)
 Patient left VM asking if Dr. Fernand ordered for her to have a nuclear medicine test done?  She already had this test performed and was given the results of it yesterday. So we need to see if she is referring to a different test?

## 2024-04-04 ENCOUNTER — Other Ambulatory Visit: Payer: Self-pay | Admitting: Internal Medicine

## 2024-04-09 ENCOUNTER — Telehealth: Payer: Self-pay | Admitting: Internal Medicine

## 2024-04-09 NOTE — Telephone Encounter (Signed)
 Left VM stating that patient is declining her RAI therapy because she says she doesn't think she needs it. Arland wanted to inform us  and clarify that patient does need it and asked that we reach out to the patient.

## 2024-04-11 ENCOUNTER — Ambulatory Visit: Admitting: Internal Medicine

## 2024-04-11 ENCOUNTER — Encounter: Payer: Self-pay | Admitting: Internal Medicine

## 2024-04-11 ENCOUNTER — Ambulatory Visit: Payer: Self-pay | Admitting: Internal Medicine

## 2024-04-11 VITALS — BP 168/78 | HR 83 | Ht 63.0 in | Wt 200.2 lb

## 2024-04-11 DIAGNOSIS — I10 Essential (primary) hypertension: Secondary | ICD-10-CM

## 2024-04-11 DIAGNOSIS — E041 Nontoxic single thyroid nodule: Secondary | ICD-10-CM

## 2024-04-11 DIAGNOSIS — E782 Mixed hyperlipidemia: Secondary | ICD-10-CM

## 2024-04-11 DIAGNOSIS — E1165 Type 2 diabetes mellitus with hyperglycemia: Secondary | ICD-10-CM

## 2024-04-11 DIAGNOSIS — E069 Thyroiditis, unspecified: Secondary | ICD-10-CM

## 2024-04-11 DIAGNOSIS — E0590 Thyrotoxicosis, unspecified without thyrotoxic crisis or storm: Secondary | ICD-10-CM

## 2024-04-11 LAB — POCT CBG (FASTING - GLUCOSE)-MANUAL ENTRY: Glucose Fasting, POC: 105 mg/dL — AB (ref 70–99)

## 2024-04-11 NOTE — Progress Notes (Signed)
 Established Patient Office Visit  Subjective:  Patient ID: Judy Barber, female    DOB: April 19, 1956  Age: 68 y.o. MRN: [id number]  Chief Complaint  Patient presents with   Follow-up    1 month follow up    Patient comes in for her follow-up today.  She describes discomfort and tenderness of the anterior neck, along her thyroid  gland area.  Her recent blood breath had shown elevated T3 and T4 with suppressed TSH.  Her thyroid  peroxidase antibody levels were also high.  Thyroid  ultrasound showed multinodular goiter.  I-123 uptake scan showed increased uptake at 4 and 24 hours.  One of the nodules in right lobe is large enough that it warrants  FNA.  Patient has an appointment to see endocrinologist in August, would like us  to try and get an earlier appointment. Denies chest pain or palpitations, no nausea vomiting or diarrhea.  No headaches and no dizziness.  There is no weight loss. Patient advised to take an aspirin, or OTC Motrin as needed for neck discomfort. Will repeat her TSH, T4 and T3 today. She has a history of multinodular goiter but her thyroid  uptake scan in 2008 was normal.    No other concerns at this time.   Past Medical History:  Diagnosis Date   Diabetes mellitus type 2, controlled (HCC)    Heart murmur    History of atrial fibrillation    a.) s/p cardiac ablation 2005; no longer sees cardiology; no interventions for rate control or chronic anticoagulation therapy at this point   History of kidney stones    Hyperlipidemia    Hypertension    Osteoarthritis of both knees    Pneumonia    Pulmonary sarcoidosis (HCC)    Sleep apnea    has never received cpap   Vitamin D  deficiency     Past Surgical History:  Procedure Laterality Date   APPENDECTOMY  1980   BREAST BIOPSY Left 2017   benign   BREAST EXCISIONAL BIOPSY Right 2013   benign   CARDIAC ELECTROPHYSIOLOGY MAPPING AND ABLATION  2005   COLONOSCOPY  2019   KNEE ARTHROSCOPY W/ MENISCECTOMY Right  03/01/2017   LUNG BIOPSY  2005   sarcoidosis   PARTIAL KNEE ARTHROPLASTY Left 02/17/2022   Procedure: UNICOMPARTMENTAL KNEE;  Surgeon: Edie Norleen PARAS, MD;  Location: [facility];  Service: Orthopedics;  Laterality: Left;   STRABISMUS SURGERY Bilateral    as a child   TOTAL KNEE ARTHROPLASTY Right 12/06/2022   Procedure: TOTAL KNEE ARTHROPLASTY;  Surgeon: Edie Norleen PARAS, MD;  Location: [facility];  Service: Orthopedics;  Laterality: Right;   TUBAL LIGATION      Social History   Socioeconomic History   Marital status: Divorced    Spouse name: Not on file   Number of children: 0   Years of education: Not on file   Highest education level: Not on file  Occupational History   Not on file  Tobacco Use   Smoking status: Former    Current packs/day: 0.00    Average packs/day: 0.5 packs/day for 25.0 years (12.5 ttl pk-yrs)    Types: Cigarettes    Start date: 38    Quit date: 2005    Years since quitting: 20.5   Smokeless tobacco: Never  Vaping Use   Vaping status: Never Used  Substance and Sexual Activity   Alcohol use: Not Currently   Drug use: Never   Sexual activity: Not on file  Other Topics Concern  Not on file  Social History Narrative   Lives with niece   Social Drivers of Health   Financial Resource Strain: Not on file  Food Insecurity: No Food Insecurity (12/06/2022)   Hunger Vital Sign    Worried About Running Out of Food in the Last Year: Never true    Ran Out of Food in the Last Year: Never true  Transportation Needs: No Transportation Needs (12/06/2022)   PRAPARE - Administrator, Civil Service (Medical): No    Lack of Transportation (Non-Medical): No  Physical Activity: Not on file  Stress: Not on file  Social Connections: Not on file  Intimate Partner Violence: Not At Risk (12/06/2022)   Humiliation, Afraid, Rape, and Kick questionnaire    Fear of Current or Ex-Partner: No    Emotionally Abused: No    Physically Abused: No    Sexually Abused: No     Family History  Problem Relation Age of Onset   Heart attack Mother    Stroke Father    Breast cancer Cousin     Allergies  Allergen Reactions   Bee Venom Swelling   Wasp Venom Swelling   Yellow Jacket Venom Swelling    Outpatient Medications Prior to Visit  Medication Sig   acetaminophen  (TYLENOL ) 500 MG tablet Take 2 tablets (1,000 mg total) by mouth every 6 (six) hours.   alendronate (FOSAMAX) 70 MG tablet Take 1 tablet by mouth once a week   aspirin EC 81 MG tablet Take 81 mg by mouth daily. Swallow whole.   azaTHIOprine (IMURAN) 50 MG tablet Take 50 mg by mouth daily.   cholecalciferol  (VITAMIN D3) 25 MCG (1000 UNIT) tablet Take 1,000 Units by mouth daily.   dapagliflozin  propanediol (FARXIGA ) 10 MG TABS tablet Take 1 tablet (10 mg total) by mouth daily before breakfast.   furosemide  (LASIX ) 20 MG tablet Take 1 tablet by mouth once daily   glimepiride  (AMARYL ) 4 MG tablet Take 1 tablet by mouth twice daily   losartan  (COZAAR ) 50 MG tablet Take 1 tablet (50 mg total) by mouth daily.   meloxicam  (MOBIC ) 15 MG tablet Take 1 tablet (15 mg total) by mouth daily.   pantoprazole  (PROTONIX ) 40 MG tablet Take 1 tablet by mouth once daily   simvastatin  (ZOCOR ) 20 MG tablet Take 1 tablet by mouth once daily   colchicine  0.6 MG tablet Take 1 tablet (0.6 mg total) by mouth 2 (two) times daily. (Patient not taking: Reported on 04/11/2024)   No facility-administered medications prior to visit.    Review of Systems  Constitutional:  Positive for malaise/fatigue. Negative for chills, fever and weight loss.  HENT: Negative.  Negative for sore throat.   Eyes: Negative.   Respiratory: Negative.  Negative for cough and shortness of breath.   Cardiovascular: Negative.  Negative for chest pain, palpitations and leg swelling.  Gastrointestinal: Negative.  Negative for abdominal pain, constipation, diarrhea, heartburn, nausea and vomiting.  Genitourinary: Negative.  Negative for dysuria and  flank pain.  Musculoskeletal:  Positive for neck pain. Negative for joint pain and myalgias.  Skin: Negative.   Neurological: Negative.  Negative for dizziness and headaches.  Endo/Heme/Allergies: Negative.   Psychiatric/Behavioral: Negative.  Negative for depression and suicidal ideas. The patient is not nervous/anxious.        Objective:   BP (!) 168/78   Pulse 83   Ht 5' 3 (1.6 m)   Wt 200 lb 3.2 oz (90.8 kg)   SpO2 98%  BMI 35.46 kg/m   Vitals:   04/11/24 1347  BP: (!) 168/78  Pulse: 83  Height: 5' 3 (1.6 m)  Weight: 200 lb 3.2 oz (90.8 kg)  SpO2: 98%  BMI (Calculated): 35.47    Physical Exam Vitals and nursing note reviewed.  Constitutional:      Appearance: Normal appearance.  HENT:     Head: Normocephalic and atraumatic.     Nose: Nose normal.     Mouth/Throat:     Mouth: Mucous membranes are moist.     Pharynx: Oropharynx is clear.  Eyes:     Conjunctiva/sclera: Conjunctivae normal.     Pupils: Pupils are equal, round, and reactive to light.  Cardiovascular:     Rate and Rhythm: Normal rate and regular rhythm.     Pulses: Normal pulses.     Heart sounds: Normal heart sounds. No murmur heard. Pulmonary:     Effort: Pulmonary effort is normal.     Breath sounds: Normal breath sounds. No wheezing.  Abdominal:     General: Bowel sounds are normal.     Palpations: Abdomen is soft.     Tenderness: There is no abdominal tenderness. There is no right CVA tenderness or left CVA tenderness.  Musculoskeletal:        General: Normal range of motion.     Cervical back: Normal range of motion. Tenderness present.     Right lower leg: No edema.     Left lower leg: No edema.  Skin:    General: Skin is warm and dry.  Neurological:     General: No focal deficit present.     Mental Status: She is alert and oriented to person, place, and time.  Psychiatric:        Mood and Affect: Mood normal.        Behavior: Behavior normal.      Results for orders  placed or performed in visit on 04/11/24  POCT CBG (Fasting - Glucose)  Result Value Ref Range   Glucose Fasting, POC 105 (A) 70 - 99 mg/dL    Recent Results (from the past 2160 hours)  POCT CBG (Fasting - Glucose)     Status: Abnormal   Collection Time: 02/29/24 10:12 AM  Result Value Ref Range   Glucose Fasting, POC 117 (A) 70 - 99 mg/dL  POC CREATINE & ALBUMIN,URINE     Status: Normal   Collection Time: 02/29/24 11:13 AM  Result Value Ref Range   Microalbumin Ur, POC 10 mg/L   Creatinine, POC 100 mg/dL   Albumin/Creatinine Ratio, Urine, POC <30   CMP14+EGFR     Status: Abnormal   Collection Time: 02/29/24 11:29 AM  Result Value Ref Range   Glucose 105 (H) 70 - 99 mg/dL   BUN 14 8 - 27 mg/dL   Creatinine, Ser 9.29 0.57 - 1.00 mg/dL   eGFR 95 >40 fO/fpw/8.26   BUN/Creatinine Ratio 20 12 - 28   Sodium 142 134 - 144 mmol/L   Potassium 4.4 3.5 - 5.2 mmol/L   Chloride 107 (H) 96 - 106 mmol/L   CO2 23 20 - 29 mmol/L   Calcium 9.6 8.7 - 10.3 mg/dL   Total Protein 7.0 6.0 - 8.5 g/dL   Albumin 4.1 3.9 - 4.9 g/dL   Globulin, Total 2.9 1.5 - 4.5 g/dL   Bilirubin Total 0.3 0.0 - 1.2 mg/dL   Alkaline Phosphatase 103 44 - 121 IU/L   AST 18 0 - 40 IU/L  ALT 10 0 - 32 IU/L  Lipid Profile     Status: None   Collection Time: 02/29/24 11:29 AM  Result Value Ref Range   Cholesterol, Total 136 100 - 199 mg/dL   Triglycerides 68 0 - 149 mg/dL   HDL 55 >60 mg/dL   VLDL Cholesterol Cal 14 5 - 40 mg/dL   LDL Chol Calc (NIH) 67 0 - 99 mg/dL   Chol/HDL Ratio 2.5 0.0 - 4.4 ratio    Comment:                                   T. Chol/HDL Ratio                                             Men  Women                               1/2 Avg.Risk  3.4    3.3                                   Avg.Risk  5.0    4.4                                2X Avg.Risk  9.6    7.1                                3X Avg.Risk 23.4   11.0   HgB A1c     Status: Abnormal   Collection Time: 02/29/24 11:29 AM  Result  Value Ref Range   Hgb A1c MFr Bld 7.1 (H) 4.8 - 5.6 %    Comment:          Prediabetes: 5.7 - 6.4          Diabetes: >6.4          Glycemic control for adults with diabetes: <7.0    Est. average glucose Bld gHb Est-mCnc 157 mg/dL  CBC with Diff     Status: Abnormal   Collection Time: 02/29/24 11:29 AM  Result Value Ref Range   WBC 4.9 3.4 - 10.8 x10E3/uL   RBC 5.44 (H) 3.77 - 5.28 x10E6/uL   Hemoglobin 12.1 11.1 - 15.9 g/dL   Hematocrit 59.0 65.9 - 46.6 %   MCV 75 (L) 79 - 97 fL   MCH 22.2 (L) 26.6 - 33.0 pg   MCHC 29.6 (L) 31.5 - 35.7 g/dL   RDW 85.8 88.2 - 84.5 %   Platelets 352 150 - 450 x10E3/uL   Neutrophils 61 Not Estab. %   Lymphs 26 Not Estab. %   Monocytes 10 Not Estab. %   Eos 3 Not Estab. %   Basos 0 Not Estab. %   Neutrophils Absolute 3.0 1.4 - 7.0 x10E3/uL   Lymphocytes Absolute 1.3 0.7 - 3.1 x10E3/uL   Monocytes Absolute 0.5 0.1 - 0.9 x10E3/uL   EOS (ABSOLUTE) 0.2 0.0 - 0.4 x10E3/uL   Basophils Absolute 0.0 0.0 - 0.2 x10E3/uL   Immature Granulocytes 0 Not Estab. %  Immature Grans (Abs) 0.0 0.0 - 0.1 x10E3/uL  Magnesium      Status: None   Collection Time: 02/29/24 11:29 AM  Result Value Ref Range   Magnesium  2.0 1.6 - 2.3 mg/dL  Uric acid     Status: None   Collection Time: 02/29/24 11:29 AM  Result Value Ref Range   Uric Acid 3.6 3.0 - 7.2 mg/dL    Comment:            Therapeutic target for gout patients: <6.0  TSH     Status: Abnormal   Collection Time: 02/29/24 11:29 AM  Result Value Ref Range   TSH <0.005 (L) 0.450 - 4.500 uIU/mL  Vitamin D  (25 hydroxy)     Status: None   Collection Time: 02/29/24 11:29 AM  Result Value Ref Range   Vit D, 25-Hydroxy 32.7 30.0 - 100.0 ng/mL    Comment: Vitamin D  deficiency has been defined by the Institute of Medicine and an Endocrine Society practice guideline as a level of serum 25-OH vitamin D  less than 20 ng/mL (1,2). The Endocrine Society went on to further define vitamin D  insufficiency as a level between 21  and 29 ng/mL (2). 1. IOM (Institute of Medicine). 2010. Dietary reference    intakes for calcium and D. [place]: The    [employer]. 2. Holick MF, Binkley Beardsley, Bischoff-Ferrari HA, et al.    Evaluation, treatment, and prevention of vitamin D     deficiency: an Endocrine Society clinical practice    guideline. JCEM. 2011 Jul; 96(7):1911-30.   [id number]     Status: Abnormal   Collection Time: 02/29/24 11:29 AM  Result Value Ref Range   TSH <0.005 (L) 0.450 - 4.500 uIU/mL   T3, Free 9.1 (H) 2.0 - 4.4 pg/mL   Free T4 2.62 (H) 0.82 - 1.77 ng/dL  Specimen status report     Status: None   Collection Time: 02/29/24 11:29 AM  Result Value Ref Range   specimen status report Comment     Comment: Written Authorization Written Authorization Written Authorization Received. Authorization received from Rayyan Orsborn 03-01-2024 Logged by Othel Gash   Anti-TPO Ab (RDL)     Status: None   Collection Time: 03/05/24  8:40 AM  Result Value Ref Range   Anti-TPO Ab (RDL) CANCELED IU/mL    Comment: Duplicate procedure ordered.  Result canceled by the ancillary.   Thyroid  antibodies (Thyroperoxidase & Thyroglobulin)     Status: Abnormal   Collection Time: 03/05/24  8:40 AM  Result Value Ref Range   Thyroperoxidase Ab SerPl-aCnc 53 (H) 0 - 34 IU/mL   Thyroglobulin Antibody <1.0 0.0 - 0.9 IU/mL    Comment: Thyroglobulin Antibody measured by [employer] Methodology It should be noted that the presence of thyroglobulin antibodies may not be pathogenic nor diagnostic, especially at very low levels. The assay manufacturer has found that four percent of individuals without evidence of thyroid  disease or autoimmunity will have positive TgAb levels up to 4 IU/mL.   POCT CBG (Fasting - Glucose)     Status: Abnormal   Collection Time: 03/18/24  1:56 PM  Result Value Ref Range   Glucose Fasting, POC 146 (A) 70 - 99 mg/dL  POCT CBG (Fasting - Glucose)     Status: Abnormal    Collection Time: 04/11/24  1:51 PM  Result Value Ref Range   Glucose Fasting, POC 105 (A) 70 - 99 mg/dL      Assessment & Plan:  Patient to continue her  current medications.  Awaits endocrine appointment visit. Meanwhile can take otc NSAIDS for neck pain. Check thyroid  profile again today. Problem List Items Addressed This Visit     Essential hypertension, benign - Primary   Type 2 diabetes mellitus with hyperglycemia, without long-term current use of insulin  (HCC)   Relevant Orders   POCT CBG (Fasting - Glucose) (Completed)   Mixed hyperlipidemia   Other Visit Diagnoses       Hyperthyroiditis       Relevant Orders   Ambulatory referral to Endocrinology     Hyperthyroidism       Relevant Orders   TSH+T4F+T3Free   Thyroid  antibodies (Thyroperoxidase & Thyroglobulin)     Thyroid  nodule           Return in about 1 month (around 05/12/2024).   Total time spent: 30 minutes  FERNAND FREDY RAMAN, MD  04/11/2024   This document may have been prepared by [facility] Voice Recognition software and as such may include unintentional dictation errors.

## 2024-04-13 LAB — THYROID ANTIBODIES (THYROPEROXIDASE & THYROGLOBULIN)
Thyroglobulin Antibody: <1 [IU]/mL (ref 0.0–0.9)
Thyroperoxidase Ab SerPl-aCnc: 58 [IU]/mL — ABNORMAL HIGH (ref 0–34)

## 2024-04-13 LAB — TSH+T4F+T3FREE
Free T4: 2.75 ng/dL — ABNORMAL HIGH (ref 0.82–1.77)
T3, Free: 10.7 pg/mL — ABNORMAL HIGH (ref 2.0–4.4)
TSH: <0.005 u[IU]/mL — ABNORMAL LOW (ref 0.450–4.500)

## 2024-04-15 NOTE — Progress Notes (Signed)
 Patient informed.

## 2024-04-16 ENCOUNTER — Ambulatory Visit (INDEPENDENT_AMBULATORY_CARE_PROVIDER_SITE_OTHER): Admitting: "Endocrinology

## 2024-04-16 ENCOUNTER — Encounter: Payer: Self-pay | Admitting: "Endocrinology

## 2024-04-16 VITALS — BP 134/80 | HR 74 | Ht 63.0 in | Wt 200.0 lb

## 2024-04-16 DIAGNOSIS — E0590 Thyrotoxicosis, unspecified without thyrotoxic crisis or storm: Secondary | ICD-10-CM

## 2024-04-16 DIAGNOSIS — E042 Nontoxic multinodular goiter: Secondary | ICD-10-CM | POA: Diagnosis not present

## 2024-04-16 MED ORDER — METHIMAZOLE 10 MG PO TABS: 10.0000 mg | ORAL_TABLET | Freq: Two times a day (BID) | ORAL | 2 refills | Status: DC

## 2024-04-16 NOTE — Patient Instructions (Signed)
  If you notice any symptoms of worsening fatigue, fever with sore throat, loss of appetite, yellowing of eyes, dark urine, joint pains, sores in the mouth, itchy rash, light colored stools or abdominal pain, please stop the medication and call us immediately as this can be a serious side effect of the medication.

## 2024-04-16 NOTE — Progress Notes (Signed)
 Outpatient Endocrinology Note Judy Birmingham, MD  04/16/24  Judy Barber Mar 23, 1956 [id number]  Referring Provider: Fernand Fredy RAMAN, MD Primary Care Provider: Fernand Fredy RAMAN, MD Subjective  No chief complaint on file.  Assessment & Plan  Diagnoses and all orders for this visit:  Hyperthyroidism -     TSH -     T3, free -     T4, free -     TRAb (TSH Receptor Binding Antibody) -     Thyroid  stimulating immunoglobulin  Multinodular goiter -     US  FNA BX THYROID  1ST LESION AFIRMA; Future -     US  FNA BX THYROID  1ST LESION AFIRMA  Other orders -     methimazole  (TAPAZOLE ) 10 MG tablet; Take 1 tablet (10 mg total) by mouth 2 (two) times daily.   Judy Barber is currently taking no thyroid  medication. Patient was biochemically hyperthyroid on last few labs.  Discussed the etiology for hyperthyroidism. Educated on thyroid  axis.  Recommend the following: Take methimazole  10 mg twice a day. Repeat labs in 3 months or sooner if symptoms of hyper or hypothyroidism develop.  Educated on definitive options of treatment including RAI therapy and surgery. Patient does not want RAI at this time.  Counseled on: -complications of untreated hyperthyroidism including atrial fibrillation, heart failure and osteoporosis -side effects of Methimazole  including but not limited to allergic reaction, rash, bone marrow suppression, liver dysfunction and teratogenic potential -implications in pregnancy and breastfeeding -compliance and follow up needs    03/28/24 THYROID  SCAN AND UPTAKE - 4 AND 24 HOURS reported multinodular goiter with prominent pyramidal lobe, similar to prior examination. Increased 4 and 24 hour thyroid  uptake, new since prior examination. 03/08/24 thyroid  U/S reported Multinodular goiter.  Asymmetrically smaller LEFT thyroid  lobe. Nodule 3 (TI-RADS 4), measuring 2.1 cm, located in the mid RIGHT thyroid  lobe, meets criteria for FNA. Nodule 2 (TI-RADS 4), measuring 1.0 cm,  located in the superior RIGHT thyroid  lobe meets criteria for imaging follow-up. Annual ultrasound surveillance is recommended until 5 years of stability is documented. 1.5 x 0.5 x 1.2 cm oval isoechoic structure seen in the RIGHT neck, possibly related to a lymph node. Recommend follow-up ultrasound soft tissue neck in 6 months to document stability of this structure.  I have reviewed current medications, nurse's notes, allergies, vital signs, past medical and surgical history, family medical history, and social history for this encounter. Counseled patient on symptoms, examination findings, lab findings, imaging results, treatment decisions and monitoring and prognosis. The patient understood the recommendations and agrees with the treatment plan. All questions regarding treatment plan were fully answered.   Return in about 4 weeks (around 05/14/2024) for visit + labs before next visit.   Judy Birmingham, MD  04/16/24   I have reviewed current medications, nurse's notes, allergies, vital signs, past medical and surgical history, family medical history, and social history for this encounter. Counseled patient on symptoms, examination findings, lab findings, imaging results, treatment decisions and monitoring and prognosis. The patient understood the recommendations and agrees with the treatment plan. All questions regarding treatment plan were fully answered.   History of Present Illness Judy Barber is a 68 y.o. year old female who presents to our clinic with hyperthyroidism diagnosed in 2025.    Was on some thyroid  medication in 2008, doesn't remember name or when it was stopped.    Symptoms suggestive of HYPERTHYROIDISM:  weight loss  No heat intolerance Yes hyperdefecation  No palpitations  No excess sweating yes   Compressive symptoms:  dysphagia  No dysphonia  No positional dyspnea (especially with simultaneous arms elevation)  No  Smokes  No On biotin  No Personal history of  head/neck surgery/irradiation  No  Grave's Ophthalmopathy Clinical Activity Score: 0/9  03/08/24 THYROID  ULTRASOUND   TECHNIQUE: Ultrasound examination of the thyroid  gland and adjacent soft tissues was performed.   COMPARISON:  None available   FINDINGS: Parenchymal Echotexture: Moderately heterogenous   Isthmus: 0.7 cm   Right lobe: 5.1 x 2.3 x 2.4 cm   Left lobe: 2.5 x 0.9 x 1.2 cm   _________________________________________________________   Estimated total number of nodules >/= 1 cm: 3   Number of spongiform nodules >/=  2 cm not described below (TR1): 0   Number of mixed cystic and solid nodules >/= 1.5 cm not described below (TR2): 0   _________________________________________________________   Nodule # 1:   Location: Isthmus; RIGHT   Maximum size: 1.2 cm; Other 2 dimensions: 0.5 x 1.2 cm   Composition: mixed cystic and solid (1)   Echogenicity: isoechoic (1)   Shape: not taller-than-wide (0)   Margins: ill-defined (0)   Echogenic foci: none (0)   ACR TI-RADS total points: 2.   ACR TI-RADS risk category: TR2 (2 points).   ACR TI-RADS recommendations:   This nodule does NOT meet TI-RADS criteria for biopsy or dedicated follow-up.   _________________________________________________________   Nodule # 2:   Location: RIGHT; superior   Maximum size: 1.0 cm; Other 2 dimensions: 0.5 x 1.0 cm   Composition: solid/almost completely solid (2)   Echogenicity: hypoechoic (2)   Shape: not taller-than-wide (0)   Margins: smooth (0)   Echogenic foci: none (0)   ACR TI-RADS total points: 4.   ACR TI-RADS risk category: TR4 (4-6 points).   ACR TI-RADS recommendations:   *Given size (>/= 1 - 1.4 cm) and appearance, a follow-up ultrasound in 1 year should be considered based on TI-RADS criteria.   _________________________________________________________   Nodule # 3:   Location: RIGHT; Mid   Maximum size: 2.1 cm; Other 2 dimensions: 1.4 x  2.0 cm   Composition: solid/almost completely solid (2)   Echogenicity: hypoechoic (2)   Shape: not taller-than-wide (0)   Margins: smooth (0)   Echogenic foci: macrocalcifications (1)   ACR TI-RADS total points: 5.   ACR TI-RADS risk category: TR4 (4-6 points).   ACR TI-RADS recommendations:   **Given size (>/= 1.5 cm) and appearance, fine needle aspiration of this moderately suspicious nodule should be considered based on TI-RADS criteria.   _________________________________________________________   1.5 x 0.5 cm oval isoechoic structure in the RIGHT neck, possibly related to a nonenlarged lymph node.   IMPRESSION: 1. Multinodular goiter.  Asymmetrically smaller LEFT thyroid  lobe. 2. Nodule 3 (TI-RADS 4), measuring 2.1 cm, located in the mid RIGHT thyroid  lobe, meets criteria for FNA. 3. Nodule 2 (TI-RADS 4), measuring 1.0 cm, located in the superior RIGHT thyroid  lobe meets criteria for imaging follow-up. Annual ultrasound surveillance is recommended until 5 years of stability is documented. 4. 1.5 x 0.5 x 1.2 cm oval isoechoic structure seen in the RIGHT neck, possibly related to a lymph node. Recommend follow-up ultrasound soft tissue neck in 6 months to document stability of this structure.  ______________ 03/28/24 THYROID  SCAN AND UPTAKE - 4 AND 24 HOURS   TECHNIQUE: Following oral administration of I-123 capsule, anterior planar imaging was acquired at 24 hours. Thyroid  uptake was calculated with a thyroid   probe at 4-6 hours and 24 hours.   RADIOPHARMACEUTICALS:  433.7 uCi I-123 sodium iodide p.o.   COMPARISON:  12/19/2006   FINDINGS: Similar to prior examination, the left thyroid  lobe is asymmetrically smaller than the right and a prominent pyramidal lobe is identified. Heterogeneity of uptake suggests an underlying multinodular goiter.   4 hour I-123 uptake = 28.7% (normal 5-20%)   24 hour I-123 uptake = 69.4% (normal 10-30%)    IMPRESSION: Multinodular goiter with prominent pyramidal lobe, similar to prior examination.   Increased 4 and 24 hour thyroid  uptake, new since prior examination.   Physical Exam  BP 134/80   Pulse 74   Ht 5' 3 (1.6 m)   Wt 200 lb (90.7 kg)   SpO2 98%   BMI 35.43 kg/m  Constitutional: well developed, well nourished Head: normocephalic, atraumatic, no exophthalmos Eyes: sclera anicteric, no redness Neck: nodules or thyromegaly no appreciated on exam  Lungs: normal respiratory effort Neurology: alert and oriented, no fine hand tremor Skin: dry, no appreciable rashes Musculoskeletal: no appreciable defects Psychiatric: normal mood and affect  Allergies Allergies  Allergen Reactions   Bee Venom Swelling   Wasp Venom Swelling   Yellow Jacket Venom Swelling    Current Medications Patient's Medications  New Prescriptions   METHIMAZOLE  (TAPAZOLE ) 10 MG TABLET    Take 1 tablet (10 mg total) by mouth 2 (two) times daily.  Previous Medications   ACETAMINOPHEN  (TYLENOL ) 500 MG TABLET    Take 2 tablets (1,000 mg total) by mouth every 6 (six) hours.   ALENDRONATE (FOSAMAX) 70 MG TABLET    Take 1 tablet by mouth once a week   ASPIRIN EC 81 MG TABLET    Take 81 mg by mouth daily. Swallow whole.   AZATHIOPRINE (IMURAN) 50 MG TABLET    Take 50 mg by mouth daily.   CHOLECALCIFEROL  (VITAMIN D3) 25 MCG (1000 UNIT) TABLET    Take 1,000 Units by mouth daily.   COLCHICINE  0.6 MG TABLET    Take 1 tablet (0.6 mg total) by mouth 2 (two) times daily.   DAPAGLIFLOZIN  PROPANEDIOL (FARXIGA ) 10 MG TABS TABLET    Take 1 tablet (10 mg total) by mouth daily before breakfast.   FUROSEMIDE  (LASIX ) 20 MG TABLET    Take 1 tablet by mouth once daily   GLIMEPIRIDE  (AMARYL ) 4 MG TABLET    Take 1 tablet by mouth twice daily   LOSARTAN  (COZAAR ) 50 MG TABLET    Take 1 tablet (50 mg total) by mouth daily.   MELOXICAM  (MOBIC ) 15 MG TABLET    Take 1 tablet (15 mg total) by mouth daily.   PANTOPRAZOLE  (PROTONIX )  40 MG TABLET    Take 1 tablet by mouth once daily   SIMVASTATIN  (ZOCOR ) 20 MG TABLET    Take 1 tablet by mouth once daily  Modified Medications   No medications on file  Discontinued Medications   No medications on file    Past Medical History Past Medical History:  Diagnosis Date   Diabetes mellitus type 2, controlled (HCC)    Heart murmur    History of atrial fibrillation    a.) s/p cardiac ablation 2005; no longer sees cardiology; no interventions for rate control or chronic anticoagulation therapy at this point   History of kidney stones    Hyperlipidemia    Hypertension    Osteoarthritis of both knees    Pneumonia    Pulmonary sarcoidosis (HCC)    Sleep apnea  has never received cpap   Vitamin D  deficiency     Past Surgical History Past Surgical History:  Procedure Laterality Date   APPENDECTOMY  1980   BREAST BIOPSY Left 2017   benign   BREAST EXCISIONAL BIOPSY Right 2013   benign   CARDIAC ELECTROPHYSIOLOGY MAPPING AND ABLATION  2005   COLONOSCOPY  2019   KNEE ARTHROSCOPY W/ MENISCECTOMY Right 03/01/2017   LUNG BIOPSY  2005   sarcoidosis   PARTIAL KNEE ARTHROPLASTY Left 02/17/2022   Procedure: UNICOMPARTMENTAL KNEE;  Surgeon: Edie Norleen PARAS, MD;  Location: [facility];  Service: Orthopedics;  Laterality: Left;   STRABISMUS SURGERY Bilateral    as a child   TOTAL KNEE ARTHROPLASTY Right 12/06/2022   Procedure: TOTAL KNEE ARTHROPLASTY;  Surgeon: Edie Norleen PARAS, MD;  Location: [facility];  Service: Orthopedics;  Laterality: Right;   TUBAL LIGATION      Family History family history includes Breast cancer in her cousin; Heart attack in her mother; Stroke in her father.  Social History Social History   Socioeconomic History   Marital status: Divorced    Spouse name: Not on file   Number of children: 0   Years of education: Not on file   Highest education level: Not on file  Occupational History   Not on file  Tobacco Use   Smoking status: Former    Current  packs/day: 0.00    Average packs/day: 0.5 packs/day for 25.0 years (12.5 ttl pk-yrs)    Types: Cigarettes    Start date: 30    Quit date: 2005    Years since quitting: 20.5   Smokeless tobacco: Never  Vaping Use   Vaping status: Never Used  Substance and Sexual Activity   Alcohol use: Not Currently   Drug use: Never   Sexual activity: Not on file  Other Topics Concern   Not on file  Social History Narrative   Lives with niece   Social Drivers of Health   Financial Resource Strain: Not on file  Food Insecurity: No Food Insecurity (12/06/2022)   Hunger Vital Sign    Worried About Running Out of Food in the Last Year: Never true    Ran Out of Food in the Last Year: Never true  Transportation Needs: No Transportation Needs (12/06/2022)   PRAPARE - Administrator, Civil Service (Medical): No    Lack of Transportation (Non-Medical): No  Physical Activity: Not on file  Stress: Not on file  Social Connections: Not on file  Intimate Partner Violence: Not At Risk (12/06/2022)   Humiliation, Afraid, Rape, and Kick questionnaire    Fear of Current or Ex-Partner: No    Emotionally Abused: No    Physically Abused: No    Sexually Abused: No    Laboratory Investigations Lab Results  Component Value Date   TSH <0.005 (L) 04/11/2024   TSH <0.005 (L) 02/29/2024   TSH <0.005 (L) 02/29/2024   FREET4 2.75 (H) 04/11/2024   FREET4 2.62 (H) 02/29/2024     No results found for: TSI   No components found for: TRAB   Lab Results  Component Value Date   CHOL 136 02/29/2024   Lab Results  Component Value Date   HDL 55 02/29/2024   Lab Results  Component Value Date   LDLCALC 67 02/29/2024   Lab Results  Component Value Date   TRIG 68 02/29/2024   Lab Results  Component Value Date   CHOLHDL 2.5 02/29/2024   Lab  Results  Component Value Date   CREATININE 0.70 02/29/2024   No results found for: GFR    Component Value Date/Time   NA 142 02/29/2024 1129   NA  137 08/08/2013 0052   K 4.4 02/29/2024 1129   K 3.6 08/08/2013 0052   CL 107 (H) 02/29/2024 1129   CL 103 08/08/2013 0052   CO2 23 02/29/2024 1129   CO2 28 08/08/2013 0052   GLUCOSE 105 (H) 02/29/2024 1129   GLUCOSE 216 (H) 12/07/2022 0540   GLUCOSE 209 (H) 08/08/2013 0052   BUN 14 02/29/2024 1129   BUN 12 08/08/2013 0052   CREATININE 0.70 02/29/2024 1129   CREATININE 1.20 08/08/2013 0052   CALCIUM 9.6 02/29/2024 1129   CALCIUM 9.2 08/08/2013 0052   PROT 7.0 02/29/2024 1129   ALBUMIN 4.1 02/29/2024 1129   AST 18 02/29/2024 1129   ALT 10 02/29/2024 1129   ALKPHOS 103 02/29/2024 1129   BILITOT 0.3 02/29/2024 1129   GFRNONAA >60 12/07/2022 0540   GFRNONAA 50 (L) 08/08/2013 0052   GFRAA 59 (L) 08/08/2013 0052      Latest Ref Rng & Units 02/29/2024   11:29 AM 04/04/2023    2:26 PM 12/30/2022   11:49 AM  BMP  Glucose 70 - 99 mg/dL 894  880  775   BUN 8 - 27 mg/dL 14  13  9    Creatinine 0.57 - 1.00 mg/dL 9.29  9.17  9.21   BUN/Creat Ratio 12 - 28 20  16  12    Sodium 134 - 144 mmol/L 142  142  144   Potassium 3.5 - 5.2 mmol/L 4.4  4.9  4.5   Chloride 96 - 106 mmol/L 107  104  106   CO2 20 - 29 mmol/L 23  25  25    Calcium 8.7 - 10.3 mg/dL 9.6  9.6  89.6        Component Value Date/Time   WBC 4.9 02/29/2024 1129   WBC 10.3 12/07/2022 0540   RBC 5.44 (H) 02/29/2024 1129   RBC 5.32 (H) 12/07/2022 0540   HGB 12.1 02/29/2024 1129   HCT 40.9 02/29/2024 1129   PLT 352 02/29/2024 1129   MCV 75 (L) 02/29/2024 1129   MCV 74 (L) 08/08/2013 0052   MCH 22.2 (L) 02/29/2024 1129   MCH 22.2 (L) 12/07/2022 0540   MCHC 29.6 (L) 02/29/2024 1129   MCHC 30.2 12/07/2022 0540   RDW 14.1 02/29/2024 1129   RDW 14.2 08/08/2013 0052   LYMPHSABS 1.3 02/29/2024 1129   MONOABS 0.6 02/04/2022 1458   EOSABS 0.2 02/29/2024 1129   BASOSABS 0.0 02/29/2024 1129      Parts of this note may have been dictated using voice recognition software. There may be variances in spelling and vocabulary which  are unintentional. Not all errors are proofread. Please notify the dino if any discrepancies are noted or if the meaning of any statement is not clear.

## 2024-04-25 ENCOUNTER — Other Ambulatory Visit (HOSPITAL_COMMUNITY)
Admission: RE | Admit: 2024-04-25 | Discharge: 2024-04-25 | Disposition: A | Discharge status: Home / Self Care | Source: Ambulatory Visit | Attending: Student | Admitting: Student

## 2024-04-25 ENCOUNTER — Ambulatory Visit
Admission: RE | Admit: 2024-04-25 | Discharge: 2024-04-25 | Disposition: A | Discharge status: Home / Self Care | Source: Ambulatory Visit | Attending: "Endocrinology | Admitting: "Endocrinology

## 2024-04-25 DIAGNOSIS — E042 Nontoxic multinodular goiter: Secondary | ICD-10-CM | POA: Diagnosis present

## 2024-04-29 ENCOUNTER — Other Ambulatory Visit: Payer: Self-pay | Admitting: Internal Medicine

## 2024-04-29 DIAGNOSIS — K219 Gastro-esophageal reflux disease without esophagitis: Secondary | ICD-10-CM

## 2024-04-30 ENCOUNTER — Emergency Department
Admission: EM | Admit: 2024-04-30 | Discharge: 2024-05-01 | Disposition: A | Discharge status: Home / Self Care | Attending: Emergency Medicine | Admitting: Emergency Medicine

## 2024-04-30 ENCOUNTER — Emergency Department

## 2024-04-30 ENCOUNTER — Other Ambulatory Visit: Payer: Self-pay

## 2024-04-30 DIAGNOSIS — E119 Type 2 diabetes mellitus without complications: Secondary | ICD-10-CM | POA: Insufficient documentation

## 2024-04-30 DIAGNOSIS — Z7982 Long term (current) use of aspirin: Secondary | ICD-10-CM | POA: Insufficient documentation

## 2024-04-30 DIAGNOSIS — Z96651 Presence of right artificial knee joint: Secondary | ICD-10-CM | POA: Diagnosis not present

## 2024-04-30 DIAGNOSIS — I10 Essential (primary) hypertension: Secondary | ICD-10-CM | POA: Diagnosis not present

## 2024-04-30 DIAGNOSIS — Z7984 Long term (current) use of oral hypoglycemic drugs: Secondary | ICD-10-CM | POA: Diagnosis not present

## 2024-04-30 DIAGNOSIS — Z79899 Other long term (current) drug therapy: Secondary | ICD-10-CM | POA: Insufficient documentation

## 2024-04-30 DIAGNOSIS — M546 Pain in thoracic spine: Secondary | ICD-10-CM | POA: Diagnosis present

## 2024-04-30 LAB — BASIC METABOLIC PANEL WITH GFR
Anion gap: 8 (ref 5–15)
BUN: 12 mg/dL (ref 8–23)
CO2: 22 mmol/L (ref 22–32)
Calcium: 9 mg/dL (ref 8.9–10.3)
Chloride: 107 mmol/L (ref 98–111)
Creatinine, Ser: 0.84 mg/dL (ref 0.44–1.00)
GFR, Estimated: >60 mL/min (ref >60)
Glucose, Bld: 201 mg/dL — ABNORMAL HIGH (ref 70–99)
Potassium: 3.5 mmol/L (ref 3.5–5.1)
Sodium: 137 mmol/L (ref 135–145)

## 2024-04-30 LAB — CBC
HCT: 40.6 % (ref 36.0–46.0)
Hemoglobin: 12.4 g/dL (ref 12.0–15.0)
MCH: 22.5 pg — ABNORMAL LOW (ref 26.0–34.0)
MCHC: 30.5 g/dL (ref 30.0–36.0)
MCV: 73.6 fL — ABNORMAL LOW (ref 80.0–100.0)
Platelets: 373 K/uL (ref 150–400)
RBC: 5.52 MIL/uL — ABNORMAL HIGH (ref 3.87–5.11)
RDW: 13.9 % (ref 11.5–15.5)
WBC: 4.9 K/uL (ref 4.0–10.5)
nRBC: 0 % (ref 0.0–0.2)

## 2024-04-30 LAB — TROPONIN I (HIGH SENSITIVITY)
Troponin I (High Sensitivity): 6 ng/L (ref <18)
Troponin I (High Sensitivity): 8 ng/L (ref <18)

## 2024-04-30 MED ORDER — LIDOCAINE 5 % EX PTCH
1.0000 | MEDICATED_PATCH | CUTANEOUS | Status: DC
  Administered 2024-04-30: 1 via TRANSDERMAL
  Filled 2024-04-30: qty 1

## 2024-04-30 MED ORDER — KETOROLAC TROMETHAMINE 30 MG/ML IJ SOLN
30.0000 mg | Freq: Once | INTRAMUSCULAR | Status: AC
  Administered 2024-05-01: 30 mg via INTRAMUSCULAR
  Filled 2024-04-30: qty 1

## 2024-04-30 NOTE — ED Provider Notes (Signed)
 [facility] Provider Note    Event Date/Time   First MD Initiated Contact with Patient 04/30/24 2337     (approximate)   History   Back Pain and Chest Pain   HPI  Judy Barber is a 68 y.o. female with story of atrial fibrillation, hypertension, hyperlipidemia, diabetes, sarcoidosis who presents to the emergency department with complaints of mid back pain. Thinks she may have hurt back getting off a bed Thursday when she pushed herself off the bed.  States that she did not use her arms like she normally would and thinks this could have injured her. Started hurting in back where bra is and now radiates towards the front of her chest bilaterally Tizanidine given yesterday with no relief No falls No SOB, cough, fever No urinary symptoms No numbness, weakness, incontinence Can walk without difficulty No back surgeries Requesting an x-ray of her back   History provided by patient.    Past Medical History:  Diagnosis Date   Diabetes mellitus type 2, controlled (HCC)    Heart murmur    History of atrial fibrillation    a.) s/p cardiac ablation 2005; no longer sees cardiology; no interventions for rate control or chronic anticoagulation therapy at this point   History of kidney stones    Hyperlipidemia    Hypertension    Osteoarthritis of both knees    Pneumonia    Pulmonary sarcoidosis (HCC)    Sleep apnea    has never received cpap   Vitamin D  deficiency     Past Surgical History:  Procedure Laterality Date   APPENDECTOMY  1980   BREAST BIOPSY Left 2017   benign   BREAST EXCISIONAL BIOPSY Right 2013   benign   CARDIAC ELECTROPHYSIOLOGY MAPPING AND ABLATION  2005   COLONOSCOPY  2019   KNEE ARTHROSCOPY W/ MENISCECTOMY Right 03/01/2017   LUNG BIOPSY  2005   sarcoidosis   PARTIAL KNEE ARTHROPLASTY Left 02/17/2022   Procedure: UNICOMPARTMENTAL KNEE;  Surgeon: Edie Norleen PARAS, MD;  Location: [facility];  Service: Orthopedics;  Laterality: Left;    STRABISMUS SURGERY Bilateral    as a child   TOTAL KNEE ARTHROPLASTY Right 12/06/2022   Procedure: TOTAL KNEE ARTHROPLASTY;  Surgeon: Edie Norleen PARAS, MD;  Location: [facility];  Service: Orthopedics;  Laterality: Right;   TUBAL LIGATION      MEDICATIONS:  Prior to Admission medications   Medication Sig Start Date End Date Taking? Authorizing Provider  acetaminophen  (TYLENOL ) 500 MG tablet Take 2 tablets (1,000 mg total) by mouth every 6 (six) hours. 12/07/22   Kip Lynwood Double, PA-C  alendronate (FOSAMAX) 70 MG tablet Take 1 tablet by mouth once a week 04/04/24   Fernand Fredy RAMAN, MD  aspirin EC 81 MG tablet Take 81 mg by mouth daily. Swallow whole.    [provider]  azaTHIOprine (IMURAN) 50 MG tablet Take 50 mg by mouth daily.    [provider]  cholecalciferol  (VITAMIN D3) 25 MCG (1000 UNIT) tablet Take 1,000 Units by mouth daily.    [provider]  colchicine  0.6 MG tablet Take 1 tablet (0.6 mg total) by mouth 2 (two) times daily. 12/30/22 04/16/24  Fernand Fredy RAMAN, MD  dapagliflozin  propanediol (FARXIGA ) 10 MG TABS tablet Take 1 tablet (10 mg total) by mouth daily before breakfast. 02/22/24   Fernand Fredy RAMAN, MD  furosemide  (LASIX ) 20 MG tablet Take 1 tablet by mouth once daily 03/07/23   Fernand Fredy RAMAN, MD  glimepiride  (AMARYL ) 4 MG tablet Take 1 tablet by mouth twice daily 11/06/23   Fernand Fredy RAMAN, MD  losartan  (COZAAR ) 50 MG tablet Take 1 tablet (50 mg total) by mouth daily. 02/29/24 02/28/25  Fernand Fredy RAMAN, MD  meloxicam  (MOBIC ) 15 MG tablet Take 1 tablet (15 mg total) by mouth daily. 12/30/22   Fernand Fredy RAMAN, MD  methimazole  (TAPAZOLE ) 10 MG tablet Take 1 tablet (10 mg total) by mouth 2 (two) times daily. 04/16/24 05/16/24  Dartha Ernst, MD  pantoprazole  (PROTONIX ) 40 MG tablet Take 1 tablet by mouth once daily 04/29/24   Fernand Fredy RAMAN, MD  simvastatin  (ZOCOR ) 20 MG tablet Take 1 tablet by mouth once daily 02/29/24   Fernand Fredy RAMAN, MD    Physical Exam   Triage  Vital Signs: ED Triage Vitals  Encounter Vitals Group     BP 04/30/24 2023 (!) 190/63     Girls Systolic BP Percentile --      Girls Diastolic BP Percentile --      Boys Systolic BP Percentile --      Boys Diastolic BP Percentile --      Pulse Rate 04/30/24 2023 93     Resp 04/30/24 2023 18     Temp 04/30/24 2023 98.2 F (36.8 C)     Temp Source 04/30/24 2023 Oral     SpO2 04/30/24 2023 98 %     Weight 04/30/24 2019 200 lb (90.7 kg)     Height 04/30/24 2019 5' 3 (1.6 m)     Head Circumference --      Peak Flow --      Pain Score 04/30/24 2019 10     Pain Loc --      Pain Education --      Exclude from Growth Chart --     Most recent vital signs: Vitals:   04/30/24 2023 04/30/24 2225  BP: (!) 190/63 (!) 176/66  Pulse: 93 84  Resp: 18 18  Temp: 98.2 F (36.8 C) 98.7 F (37.1 C)  SpO2: 98% 96%    CONSTITUTIONAL: Alert, responds appropriately to questions. Well-appearing; well-nourished HEAD: Normocephalic, atraumatic EYES: Conjunctivae clear, pupils appear equal, sclera nonicteric ENT: normal nose; moist mucous membranes NECK: Supple, normal ROM CARD: RRR; S1 and S2 appreciated RESP: Normal chest excursion without splinting or tachypnea; breath sounds clear and equal bilaterally; no wheezes, no rhonchi, no rales, no hypoxia or respiratory distress, speaking full sentences ABD/GI: Non-distended; soft, non-tender, no rebound, no guarding, no peritoneal signs BACK: The back appears normal, tender over the mid thoracic region without step-off or deformity, no overlying skin changes EXT: Normal ROM in all joints; no deformity noted, no edema, no calf tenderness or calf swelling SKIN: Normal color for age and race; warm; no rash on exposed skin NEURO: Moves all extremities equally, normal speech, normal sensation, normal gait PSYCH: The patient's mood and manner are appropriate.   ED Results / Procedures / Treatments   LABS: (all labs ordered are listed, but only  abnormal results are displayed) Labs Reviewed  BASIC METABOLIC PANEL WITH GFR - Abnormal; Notable for the following components:      Result Value   Glucose, Bld 201 (*)    All other components within normal limits  CBC - Abnormal; Notable for the following components:   RBC 5.52 (*)    MCV 73.6 (*)    MCH 22.5 (*)    All other components within normal limits  D-DIMER, QUANTITATIVE  TROPONIN  I (HIGH SENSITIVITY)  TROPONIN I (HIGH SENSITIVITY)     EKG:  EKG Interpretation Date/Time:  Tuesday April 30 2024 20:20:29 EDT Ventricular Rate:  92 PR Interval:  170 QRS Duration:  84 QT Interval:  366 QTC Calculation: 452 R Axis:   66  Text Interpretation: Normal sinus rhythm Normal ECG When compared with ECG of 28-Nov-2022 14:12, No significant change was found Confirmed by Neomi Neptune [phone]) on 04/30/2024 11:47:05 PM         RADIOLOGY: My personal review and interpretation of imaging: Chest x-ray shows chronic changes consistent with sarcoidosis.  X-ray of the thoracic spine shows no acute abnormality.  I have personally reviewed all radiology reports.   DG Thoracic Spine 2 View Result Date: 05/01/2024 EXAM: 2 VIEW(S) XRAY OF THE THORACIC SPINE 05/01/2024 12:09:08 AM COMPARISON: None available. CLINICAL HISTORY: Back pain. Pt to ED via POV c/o back pain and lower rib pain. Pt states on Thursday her back started hurting. Went to UC yesterday and was told she pulled a muscle. Was prescribed tizanidine but had no relief. Pain radiating to lower ribs, under breasts. Hx HTN, diabetes. FINDINGS: BONES: No acute fracture. No aggressive appearing osseous lesion. Alignment is normal. DISCS AND DEGENERATIVE CHANGES: No severe degenerative changes. SOFT TISSUES: The visualized lungs are clear. IMPRESSION: 1. No acute abnormality of the thoracic spine. Electronically signed by: Franky Stanford MD 05/01/2024 12:29 AM EDT RP Workstation: [id number]   DG Chest 2 View Result Date: 04/30/2024 CLINICAL  DATA:  CP EXAM: CHEST - 2 VIEW COMPARISON:  Chest x-ray 10/15/2020 FINDINGS: The heart and mediastinal contours are unchanged. Prominent hilar vasculature. No focal consolidation. Chronic coarsened interstitial markings with no overt pulmonary edema. No pleural effusion. No pneumothorax. No acute osseous abnormality. IMPRESSION: 1. No active cardiopulmonary disease. 2. Persistent prominent hilar vasculature and chronic coarsened interstitial markings. Electronically Signed   By: Morgane  Naveau M.D.   On: 04/30/2024 20:54     PROCEDURES:  Critical Care performed: No     Procedures    IMPRESSION / MDM / ASSESSMENT AND PLAN / ED COURSE  I reviewed the triage vital signs and the nursing notes.    Patient here with complaints of mid back pain that radiates into the chest.    DIFFERENTIAL DIAGNOSIS (includes but not limited to):   Musculoskeletal pain, muscle spasm, thoracic strain, thoracic fracture, ACS, PE, doubt dissection, pneumonia, pneumothorax, CHF   Patient's presentation is most consistent with acute presentation with potential threat to life or bodily function.   PLAN: EKG nonischemic.  Chest x-ray reviewed and interpreted by myself and the radiologist as unremarkable.  Normal hemoglobin, electrolytes, troponin.  Second troponin pending.  Also obtain D-dimer.  Patient requesting x-ray of her thoracic spine.  I feel this is reasonable given patient is 68 years old, postmenopausal female and would be high risk for osteoporosis.  Will give Toradol  for pain control given she drove herself to the emergency department.   MEDICATIONS GIVEN IN ED: Medications  ketorolac  (TORADOL ) 30 MG/ML injection 30 mg (30 mg Intramuscular Given 05/01/24 0012)     ED COURSE: Second troponin negative.  D-dimer negative.  Thoracic x-ray reviewed and interpreted by myself and the radiologist and shows no acute abnormality.  Agree with patient that this is likely musculoskeletal in nature.  She is  reassured by workup today.  Will discharge with pain medication for home.  Discussed supportive care instructions and return precautions.   At this time, I do not feel there  is any life-threatening condition present. I reviewed all nursing notes, vitals, pertinent previous records.  All lab and urine results, EKGs, imaging ordered have been independently reviewed and interpreted by myself.  I reviewed all available radiology reports from any imaging ordered this visit.  Based on my assessment, I feel the patient is safe to be discharged home without further emergent workup and can continue workup as an outpatient as needed. Discussed all findings, treatment plan as well as usual and customary return precautions.  They verbalize understanding and are comfortable with this plan.  Outpatient follow-up has been provided as needed.  All questions have been answered.    CONSULTS:  none   OUTSIDE RECORDS REVIEWED: Reviewed last PCP and endocrinology notes.       FINAL CLINICAL IMPRESSION(S) / ED DIAGNOSES   Final diagnoses:  Acute midline thoracic back pain     Rx / DC Orders   ED Discharge Orders          Ordered    ibuprofen  (ADVIL ) 800 MG tablet  Every 8 hours PRN        05/01/24 0038    lidocaine  (LIDODERM ) 5 %  Every 24 hours        05/01/24 0038    oxyCODONE  (ROXICODONE ) 5 MG immediate release tablet  Every 8 hours PRN        05/01/24 0038    ondansetron  (ZOFRAN -ODT) 4 MG disintegrating tablet  Every 6 hours PRN        05/01/24 0038             Note:  This document was prepared using Dragon voice recognition software and may include unintentional dictation errors.   Diamante Truszkowski, Josette SAILOR, DO 05/01/24 [phone]

## 2024-04-30 NOTE — ED Triage Notes (Addendum)
 Pt to ED via POV c/o back pain and lower rib pain. Pt states on Thursday her back started hurting. Went to UC yesterday and was told she pulled a muscle. Was prescribed tizanidine but had no relief. Pain radiating to lower ribs, under breasts. Hx HTN, diabetes

## 2024-05-01 LAB — D-DIMER, QUANTITATIVE: D-Dimer, Quant: 0.29 ug{FEU}/mL (ref 0.00–0.50)

## 2024-05-01 LAB — CYTOLOGY - NON PAP

## 2024-05-01 MED ORDER — LIDOCAINE 5 % EX PTCH: 1.0000 | MEDICATED_PATCH | CUTANEOUS | 0 refills | Status: AC

## 2024-05-01 MED ORDER — OXYCODONE HCL 5 MG PO TABS: 5.0000 mg | ORAL_TABLET | Freq: Three times a day (TID) | ORAL | 0 refills | Status: AC | PRN

## 2024-05-01 MED ORDER — ONDANSETRON 4 MG PO TBDP
4.0000 mg | ORAL_TABLET | Freq: Four times a day (QID) | ORAL | 0 refills | Status: AC | PRN
Start: 2024-05-01 — End: ?

## 2024-05-01 MED ORDER — IBUPROFEN 800 MG PO TABS: 800.0000 mg | ORAL_TABLET | Freq: Three times a day (TID) | ORAL | 0 refills | Status: AC | PRN

## 2024-05-01 NOTE — Discharge Instructions (Addendum)

## 2024-05-09 ENCOUNTER — Ambulatory Visit: Payer: Self-pay | Admitting: "Endocrinology

## 2024-05-13 ENCOUNTER — Other Ambulatory Visit

## 2024-05-14 ENCOUNTER — Ambulatory Visit: Admitting: Internal Medicine

## 2024-05-17 ENCOUNTER — Ambulatory Visit: Admitting: "Endocrinology

## 2024-05-17 ENCOUNTER — Other Ambulatory Visit

## 2024-05-21 LAB — T3, FREE: T3, Free: 3.9 pg/mL (ref 2.3–4.2)

## 2024-05-21 LAB — T4, FREE: Free T4: 1.4 ng/dL (ref 0.8–1.8)

## 2024-05-21 LAB — TRAB (TSH RECEPTOR BINDING ANTIBODY): TRAB: 14.69 IU/L — ABNORMAL HIGH (ref <=2.00)

## 2024-05-21 LAB — TSH: TSH: <0.01 m[IU]/L — ABNORMAL LOW (ref 0.40–4.50)

## 2024-05-21 LAB — THYROID STIMULATING IMMUNOGLOBULIN: TSI: 192 %{baseline} — ABNORMAL HIGH (ref <140)

## 2024-05-22 ENCOUNTER — Ambulatory Visit (INDEPENDENT_AMBULATORY_CARE_PROVIDER_SITE_OTHER): Admitting: "Endocrinology

## 2024-05-22 ENCOUNTER — Encounter: Payer: Self-pay | Admitting: "Endocrinology

## 2024-05-22 VITALS — BP 140/70 | HR 74 | Ht 63.0 in | Wt 203.0 lb

## 2024-05-22 DIAGNOSIS — E0500 Thyrotoxicosis with diffuse goiter without thyrotoxic crisis or storm: Secondary | ICD-10-CM | POA: Diagnosis not present

## 2024-05-22 DIAGNOSIS — E042 Nontoxic multinodular goiter: Secondary | ICD-10-CM | POA: Diagnosis not present

## 2024-05-22 MED ORDER — METHIMAZOLE 10 MG PO TABS: 10.0000 mg | ORAL_TABLET | Freq: Two times a day (BID) | ORAL | 0 refills | Status: DC

## 2024-05-22 NOTE — Progress Notes (Signed)
 Outpatient Endocrinology Note [name], MD  05/22/24  Judy Barber 1956-09-07 [id number]  Referring Provider: Fernand Fredy RAMAN, MD Primary Care Provider: Fernand Fredy RAMAN, MD Subjective  No chief complaint on file.  Assessment & Plan  Diagnoses and all orders for this visit:  Graves disease -     TSH -     T3, free -     T4, free  Multinodular goiter    Judy Barber is currently taking methimazole  10 mg twice a day. Patient was biochemically hyperthyroid on last few labs.  Discussed the etiology for hyperthyroidism. Educated on thyroid  axis.  Recommend the following: continue methimazole  10 mg twice a day. Repeat labs in 3 months or sooner if symptoms of hyper or hypothyroidism develop.  Educated on definitive options of treatment including RAI therapy and surgery. Patient does not want RAI at this time.  Counseled on: -complications of untreated hyperthyroidism including atrial fibrillation, heart failure and osteoporosis -side effects of Methimazole  including but not limited to allergic reaction, rash, bone marrow suppression, liver dysfunction and teratogenic potential -implications in pregnancy and breastfeeding -compliance and follow up needs    03/28/24 THYROID  SCAN AND UPTAKE - 4 AND 24 HOURS reported multinodular goiter with prominent pyramidal lobe, similar to prior examination. Increased 4 and 24 hour thyroid  uptake, new since prior examination. 03/08/24 thyroid  U/S reported Multinodular goiter.  Asymmetrically smaller LEFT thyroid  lobe. Nodule 3 (TI-RADS 4), measuring 2.1 cm, located in the mid RIGHT thyroid  lobe, meets criteria for FNA. Nodule 2 (TI-RADS 4), measuring 1.0 cm, located in the superior RIGHT thyroid  lobe meets criteria for imaging follow-up. Annual ultrasound surveillance is recommended until 5 years of stability is documented. 1.5 x 0.5 x 1.2 cm oval isoechoic structure seen in the RIGHT neck, possibly related to a lymph node. Recommend follow-up  ultrasound soft tissue neck in 6 months to document stability of this structure.  I have reviewed current medications, nurse's notes, allergies, vital signs, past medical and surgical history, family medical history, and social history for this encounter. Counseled patient on symptoms, examination findings, lab findings, imaging results, treatment decisions and monitoring and prognosis. The patient understood the recommendations and agrees with the treatment plan. All questions regarding treatment plan were fully answered.   Return in about 2 months (around 08/05/2024) for visit + labs before next visit.   [name], MD  05/22/24   I have reviewed current medications, nurse's notes, allergies, vital signs, past medical and surgical history, family medical history, and social history for this encounter. Counseled patient on symptoms, examination findings, lab findings, imaging results, treatment decisions and monitoring and prognosis. The patient understood the recommendations and agrees with the treatment plan. All questions regarding treatment plan were fully answered.   History of Present Illness Judy Barber is a 68 y.o. year old female who presents to our clinic with hyperthyroidism diagnosed in 2025.    Was on some thyroid  medication in 2008, doesn't remember name or when it was stopped.    Symptoms suggestive of HYPO/HYPERTHYROIDISM:  weight loss/gain  Yes, few lbs Heat/cold intolerance No Constipation/hyperdefecation  No palpitations  No excess sweating yes   Compressive symptoms:  dysphagia  No dysphonia  No positional dyspnea (especially with simultaneous arms elevation)  No  Smokes  No On biotin  No Personal history of head/neck surgery/irradiation  No  Grave's Ophthalmopathy Clinical Activity Score: 0/9  Adverse Drug Effects from Methimazole  (MMI): rash No fever No throat pain No arthritis  No mouth ulcers No jaundice No loss of appetite No lymphadenopathy  No   03/08/24 THYROID  ULTRASOUND   TECHNIQUE: Ultrasound examination of the thyroid  gland and adjacent soft tissues was performed.   COMPARISON:  None available   FINDINGS: Parenchymal Echotexture: Moderately heterogenous   Isthmus: 0.7 cm   Right lobe: 5.1 x 2.3 x 2.4 cm   Left lobe: 2.5 x 0.9 x 1.2 cm   _________________________________________________________   Estimated total number of nodules >/= 1 cm: 3   Number of spongiform nodules >/=  2 cm not described below (TR1): 0   Number of mixed cystic and solid nodules >/= 1.5 cm not described below (TR2): 0   _________________________________________________________   Nodule # 1:   Location: Isthmus; RIGHT   Maximum size: 1.2 cm; Other 2 dimensions: 0.5 x 1.2 cm   Composition: mixed cystic and solid (1)   Echogenicity: isoechoic (1)   Shape: not taller-than-wide (0)   Margins: ill-defined (0)   Echogenic foci: none (0)   ACR TI-RADS total points: 2.   ACR TI-RADS risk category: TR2 (2 points).   ACR TI-RADS recommendations:   This nodule does NOT meet TI-RADS criteria for biopsy or dedicated follow-up.   _________________________________________________________   Nodule # 2:   Location: RIGHT; superior   Maximum size: 1.0 cm; Other 2 dimensions: 0.5 x 1.0 cm   Composition: solid/almost completely solid (2)   Echogenicity: hypoechoic (2)   Shape: not taller-than-wide (0)   Margins: smooth (0)   Echogenic foci: none (0)   ACR TI-RADS total points: 4.   ACR TI-RADS risk category: TR4 (4-6 points).   ACR TI-RADS recommendations:   *Given size (>/= 1 - 1.4 cm) and appearance, a follow-up ultrasound in 1 year should be considered based on TI-RADS criteria.   _________________________________________________________   Nodule # 3:   Location: RIGHT; Mid   Maximum size: 2.1 cm; Other 2 dimensions: 1.4 x 2.0 cm   Composition: solid/almost completely solid (2)   Echogenicity:  hypoechoic (2)   Shape: not taller-than-wide (0)   Margins: smooth (0)   Echogenic foci: macrocalcifications (1)   ACR TI-RADS total points: 5.   ACR TI-RADS risk category: TR4 (4-6 points).   ACR TI-RADS recommendations:   **Given size (>/= 1.5 cm) and appearance, fine needle aspiration of this moderately suspicious nodule should be considered based on TI-RADS criteria.   _________________________________________________________   1.5 x 0.5 cm oval isoechoic structure in the RIGHT neck, possibly related to a nonenlarged lymph node.   IMPRESSION: 1. Multinodular goiter.  Asymmetrically smaller LEFT thyroid  lobe. 2. Nodule 3 (TI-RADS 4), measuring 2.1 cm, located in the mid RIGHT thyroid  lobe, meets criteria for FNA. 3. Nodule 2 (TI-RADS 4), measuring 1.0 cm, located in the superior RIGHT thyroid  lobe meets criteria for imaging follow-up. Annual ultrasound surveillance is recommended until 5 years of stability is documented. 4. 1.5 x 0.5 x 1.2 cm oval isoechoic structure seen in the RIGHT neck, possibly related to a lymph node. Recommend follow-up ultrasound soft tissue neck in 6 months to document stability of this structure.  ______________ 03/28/24 THYROID  SCAN AND UPTAKE - 4 AND 24 HOURS   TECHNIQUE: Following oral administration of I-123 capsule, anterior planar imaging was acquired at 24 hours. Thyroid  uptake was calculated with a thyroid  probe at 4-6 hours and 24 hours.   RADIOPHARMACEUTICALS:  433.7 uCi I-123 sodium iodide p.o.   COMPARISON:  12/19/2006   FINDINGS: Similar to prior examination, the left thyroid  lobe  is asymmetrically smaller than the right and a prominent pyramidal lobe is identified. Heterogeneity of uptake suggests an underlying multinodular goiter.   4 hour I-123 uptake = 28.7% (normal 5-20%)   24 hour I-123 uptake = 69.4% (normal 10-30%)   IMPRESSION: Multinodular goiter with prominent pyramidal lobe, similar to  prior examination.   Increased 4 and 24 hour thyroid  uptake, new since prior examination.   Physical Exam  BP (!) 140/70   Pulse 74   Ht 5' 3 (1.6 m)   Wt 203 lb (92.1 kg)   SpO2 96%   BMI 35.96 kg/m  Constitutional: well developed, well nourished Head: normocephalic, atraumatic, no exophthalmos Eyes: sclera anicteric, no redness Neck: + nodule, no thyromegaly noted Lungs: normal respiratory effort Neurology: alert and oriented, no fine hand tremor Skin: dry, no appreciable rashes Musculoskeletal: no appreciable defects Psychiatric: normal mood and affect  Allergies Allergies  Allergen Reactions   Bee Venom Swelling   Wasp Venom Swelling   Yellow Jacket Venom Swelling    Current Medications Patient's Medications  New Prescriptions   No medications on file  Previous Medications   ACETAMINOPHEN  (TYLENOL ) 500 MG TABLET    Take 2 tablets (1,000 mg total) by mouth every 6 (six) hours.   ALENDRONATE (FOSAMAX) 70 MG TABLET    Take 1 tablet by mouth once a week   ASPIRIN EC 81 MG TABLET    Take 81 mg by mouth daily. Swallow whole.   AZATHIOPRINE (IMURAN) 50 MG TABLET    Take 50 mg by mouth daily.   CHOLECALCIFEROL  (VITAMIN D3) 25 MCG (1000 UNIT) TABLET    Take 1,000 Units by mouth daily.   COLCHICINE  0.6 MG TABLET    Take 1 tablet (0.6 mg total) by mouth 2 (two) times daily.   DAPAGLIFLOZIN  PROPANEDIOL (FARXIGA ) 10 MG TABS TABLET    Take 1 tablet (10 mg total) by mouth daily before breakfast.   FUROSEMIDE  (LASIX ) 20 MG TABLET    Take 1 tablet by mouth once daily   GLIMEPIRIDE  (AMARYL ) 4 MG TABLET    Take 1 tablet by mouth twice daily   IBUPROFEN  (ADVIL ) 800 MG TABLET    Take 1 tablet (800 mg total) by mouth every 8 (eight) hours as needed.   LOSARTAN  (COZAAR ) 50 MG TABLET    Take 1 tablet (50 mg total) by mouth daily.   MELOXICAM  (MOBIC ) 15 MG TABLET    Take 1 tablet (15 mg total) by mouth daily.   METHIMAZOLE  (TAPAZOLE ) 10 MG TABLET    Take 1 tablet (10 mg total) by mouth 2  (two) times daily.   ONDANSETRON  (ZOFRAN -ODT) 4 MG DISINTEGRATING TABLET    Take 1 tablet (4 mg total) by mouth every 6 (six) hours as needed for nausea or vomiting.   OXYCODONE  (ROXICODONE ) 5 MG IMMEDIATE RELEASE TABLET    Take 1 tablet (5 mg total) by mouth every 8 (eight) hours as needed.   PANTOPRAZOLE  (PROTONIX ) 40 MG TABLET    Take 1 tablet by mouth once daily   SIMVASTATIN  (ZOCOR ) 20 MG TABLET    Take 1 tablet by mouth once daily  Modified Medications   No medications on file  Discontinued Medications   No medications on file    Past Medical History Past Medical History:  Diagnosis Date   Diabetes mellitus type 2, controlled (HCC)    Heart murmur    History of atrial fibrillation    a.) s/p cardiac ablation 2005; no longer sees cardiology; no  interventions for rate control or chronic anticoagulation therapy at this point   History of kidney stones    Hyperlipidemia    Hypertension    Osteoarthritis of both knees    Pneumonia    Pulmonary sarcoidosis (HCC)    Sleep apnea    has never received cpap   Vitamin D  deficiency     Past Surgical History Past Surgical History:  Procedure Laterality Date   APPENDECTOMY  1980   BREAST BIOPSY Left 2017   benign   BREAST EXCISIONAL BIOPSY Right 2013   benign   CARDIAC ELECTROPHYSIOLOGY MAPPING AND ABLATION  2005   COLONOSCOPY  2019   KNEE ARTHROSCOPY W/ MENISCECTOMY Right 03/01/2017   LUNG BIOPSY  2005   sarcoidosis   PARTIAL KNEE ARTHROPLASTY Left 02/17/2022   Procedure: UNICOMPARTMENTAL KNEE;  Surgeon: Edie Norleen PARAS, MD;  Location: [facility];  Service: Orthopedics;  Laterality: Left;   STRABISMUS SURGERY Bilateral    as a child   TOTAL KNEE ARTHROPLASTY Right 12/06/2022   Procedure: TOTAL KNEE ARTHROPLASTY;  Surgeon: Edie Norleen PARAS, MD;  Location: [facility];  Service: Orthopedics;  Laterality: Right;   TUBAL LIGATION      Family History family history includes Breast cancer in her cousin; Heart attack in her mother; Stroke  in her father.  Social History Social History   Socioeconomic History   Marital status: Divorced    Spouse name: Not on file   Number of children: 0   Years of education: Not on file   Highest education level: Not on file  Occupational History   Not on file  Tobacco Use   Smoking status: Former    Current packs/day: 0.00    Average packs/day: 0.5 packs/day for 25.0 years (12.5 ttl pk-yrs)    Types: Cigarettes    Start date: 46    Quit date: 2005    Years since quitting: 20.6   Smokeless tobacco: Never  Vaping Use   Vaping status: Never Used  Substance and Sexual Activity   Alcohol use: Not Currently   Drug use: Never   Sexual activity: Not on file  Other Topics Concern   Not on file  Social History Narrative   Lives with niece   Social Drivers of Health   Financial Resource Strain: Not on file  Food Insecurity: No Food Insecurity (12/06/2022)   Hunger Vital Sign    Worried About Running Out of Food in the Last Year: Never true    Ran Out of Food in the Last Year: Never true  Transportation Needs: No Transportation Needs (12/06/2022)   PRAPARE - Administrator, Civil Service (Medical): No    Lack of Transportation (Non-Medical): No  Physical Activity: Not on file  Stress: Not on file  Social Connections: Not on file  Intimate Partner Violence: Not At Risk (12/06/2022)   Humiliation, Afraid, Rape, and Kick questionnaire    Fear of Current or Ex-Partner: No    Emotionally Abused: No    Physically Abused: No    Sexually Abused: No    Laboratory Investigations Lab Results  Component Value Date   TSH <0.01 (L) 05/17/2024   TSH <0.005 (L) 04/11/2024   TSH <0.005 (L) 02/29/2024   TSH <0.005 (L) 02/29/2024   FREET4 1.4 05/17/2024   FREET4 2.75 (H) 04/11/2024   FREET4 2.62 (H) 02/29/2024     Lab Results  Component Value Date   TSI 192 (H) 05/17/2024     No components found  for: TRAB   Lab Results  Component Value Date   CHOL 136 02/29/2024    Lab Results  Component Value Date   HDL 55 02/29/2024   Lab Results  Component Value Date   LDLCALC 67 02/29/2024   Lab Results  Component Value Date   TRIG 68 02/29/2024   Lab Results  Component Value Date   CHOLHDL 2.5 02/29/2024   Lab Results  Component Value Date   CREATININE 0.84 04/30/2024   No results found for: GFR    Component Value Date/Time   NA 137 04/30/2024 2022   NA 142 02/29/2024 1129   NA 137 08/08/2013 0052   K 3.5 04/30/2024 2022   K 3.6 08/08/2013 0052   CL 107 04/30/2024 2022   CL 103 08/08/2013 0052   CO2 22 04/30/2024 2022   CO2 28 08/08/2013 0052   GLUCOSE 201 (H) 04/30/2024 2022   GLUCOSE 209 (H) 08/08/2013 0052   BUN 12 04/30/2024 2022   BUN 14 02/29/2024 1129   BUN 12 08/08/2013 0052   CREATININE 0.84 04/30/2024 2022   CREATININE 1.20 08/08/2013 0052   CALCIUM 9.0 04/30/2024 2022   CALCIUM 9.2 08/08/2013 0052   PROT 7.0 02/29/2024 1129   ALBUMIN 4.1 02/29/2024 1129   AST 18 02/29/2024 1129   ALT 10 02/29/2024 1129   ALKPHOS 103 02/29/2024 1129   BILITOT 0.3 02/29/2024 1129   GFRNONAA >60 04/30/2024 2022   GFRNONAA 50 (L) 08/08/2013 0052   GFRAA 59 (L) 08/08/2013 0052      Latest Ref Rng & Units 04/30/2024    8:22 PM 02/29/2024   11:29 AM 04/04/2023    2:26 PM  BMP  Glucose 70 - 99 mg/dL 798  894  880   BUN 8 - 23 mg/dL 12  14  13    Creatinine 0.44 - 1.00 mg/dL 9.15  9.29  9.17   BUN/Creat Ratio 12 - 28  20  16    Sodium 135 - 145 mmol/L 137  142  142   Potassium 3.5 - 5.1 mmol/L 3.5  4.4  4.9   Chloride 98 - 111 mmol/L 107  107  104   CO2 22 - 32 mmol/L 22  23  25    Calcium 8.9 - 10.3 mg/dL 9.0  9.6  9.6        Component Value Date/Time   WBC 4.9 04/30/2024 2022   RBC 5.52 (H) 04/30/2024 2022   HGB 12.4 04/30/2024 2022   HGB 12.1 02/29/2024 1129   HCT 40.6 04/30/2024 2022   HCT 40.9 02/29/2024 1129   PLT 373 04/30/2024 2022   PLT 352 02/29/2024 1129   MCV 73.6 (L) 04/30/2024 2022   MCV 75 (L) 02/29/2024 1129    MCV 74 (L) 08/08/2013 0052   MCH 22.5 (L) 04/30/2024 2022   MCHC 30.5 04/30/2024 2022   RDW 13.9 04/30/2024 2022   RDW 14.1 02/29/2024 1129   RDW 14.2 08/08/2013 0052   LYMPHSABS 1.3 02/29/2024 1129   MONOABS 0.6 02/04/2022 1458   EOSABS 0.2 02/29/2024 1129   BASOSABS 0.0 02/29/2024 1129      Parts of this note may have been dictated using voice recognition software. There may be variances in spelling and vocabulary which are unintentional. Not all errors are proofread. Please notify the dino if any discrepancies are noted or if the meaning of any statement is not clear.

## 2024-05-28 ENCOUNTER — Ambulatory Visit (INDEPENDENT_AMBULATORY_CARE_PROVIDER_SITE_OTHER): Admitting: Internal Medicine

## 2024-05-28 ENCOUNTER — Encounter: Payer: Self-pay | Admitting: Internal Medicine

## 2024-05-28 VITALS — BP 132/80 | HR 82 | Ht 63.0 in | Wt 205.0 lb

## 2024-05-28 DIAGNOSIS — I10 Essential (primary) hypertension: Secondary | ICD-10-CM

## 2024-05-28 DIAGNOSIS — E1165 Type 2 diabetes mellitus with hyperglycemia: Secondary | ICD-10-CM

## 2024-05-28 DIAGNOSIS — K219 Gastro-esophageal reflux disease without esophagitis: Secondary | ICD-10-CM

## 2024-05-28 DIAGNOSIS — D860 Sarcoidosis of lung: Secondary | ICD-10-CM

## 2024-05-28 DIAGNOSIS — E0590 Thyrotoxicosis, unspecified without thyrotoxic crisis or storm: Secondary | ICD-10-CM | POA: Diagnosis not present

## 2024-05-28 DIAGNOSIS — E782 Mixed hyperlipidemia: Secondary | ICD-10-CM | POA: Diagnosis not present

## 2024-05-28 NOTE — Progress Notes (Signed)
 Established Patient Office Visit  Subjective:  Patient ID: Judy Barber, female    DOB: [birth year]  Age: 68 y.o. MRN: [id number]  Chief Complaint  Patient presents with   Follow-up    1 month follow up    Patient comes in for her 1 month follow-up today.  She describes discomfort and tenderness of the small joints in bilateral hands, she reports taking OTC tylenol  for symptom relief. Denies chest pain or palpitations, no nausea vomiting or diarrhea.  No headaches and no dizziness. Denies tremors. There is no weight loss. Following endocrinology for recent diagnosis of Grave's disease. Patient is in need of her annual diabetic eye exam.    No other concerns at this time.   Past Medical History:  Diagnosis Date   Diabetes mellitus type 2, controlled (HCC)    Heart murmur    History of atrial fibrillation    a.) s/p cardiac ablation 2005; no longer sees cardiology; no interventions for rate control or chronic anticoagulation therapy at this point   History of kidney stones    Hyperlipidemia    Hypertension    Osteoarthritis of both knees    Pneumonia    Pulmonary sarcoidosis (HCC)    Sleep apnea    has never received cpap   Vitamin D  deficiency     Past Surgical History:  Procedure Laterality Date   APPENDECTOMY  1980   BREAST BIOPSY Left 2017   benign   BREAST EXCISIONAL BIOPSY Right 2013   benign   CARDIAC ELECTROPHYSIOLOGY MAPPING AND ABLATION  2005   COLONOSCOPY  2019   KNEE ARTHROSCOPY W/ MENISCECTOMY Right 03/01/2017   LUNG BIOPSY  2005   sarcoidosis   PARTIAL KNEE ARTHROPLASTY Left 02/17/2022   Procedure: UNICOMPARTMENTAL KNEE;  Surgeon: Edie Norleen PARAS, MD;  Location: [facility] ORS;  Service: Orthopedics;  Laterality: Left;   STRABISMUS SURGERY Bilateral    as a child   TOTAL KNEE ARTHROPLASTY Right 12/06/2022   Procedure: TOTAL KNEE ARTHROPLASTY;  Surgeon: Edie Norleen PARAS, MD;  Location: [facility] OR[facility];  Service: Orthopedics;  Laterality: Right;   TUBAL LIGATION       Social History   Socioeconomic History   Marital status: Divorced    Spouse name: Not on file   Number of children: 0   Years of education: Not on file   Highest education level: Not on file  Occupational History   Not on file  Tobacco Use   Smoking status: Former    Current packs/day: 0.00    Average packs/day: 0.5 packs/day for 25.0 years (12.5 ttl pk-yrs)    Types: Cigarettes    Start date: 22    Quit date: 2005    Years since quitting: 20.6   Smokeless tobacco: Never  Vaping Use   Vaping status: Never Used  Substance and Sexual Activity   Alcohol use: Not Currently   Drug use: Never   Sexual activity: Not on file  Other Topics Concern   Not on file  Social History Narrative   Lives with niece   Social Drivers of Health   Financial Resource Strain: Not on file  Food Insecurity: No Food Insecurity (12/06/2022)   Hunger Vital Sign    Worried About Running Out of Food in the Last Year: Never true    Ran Out of Food in the Last Year: Never true  Transportation Needs: No Transportation Needs (12/06/2022)   PRAPARE - Administrator, Civil Service (Medical): No  Lack of Transportation (Non-Medical): No  Physical Activity: Not on file  Stress: Not on file  Social Connections: Not on file  Intimate Partner Violence: Not At Risk (12/06/2022)   Humiliation, Afraid, Rape, and Kick questionnaire    Fear of Current or Ex-Partner: No    Emotionally Abused: No    Physically Abused: No    Sexually Abused: No    Family History  Problem Relation Age of Onset   Heart attack Mother    Stroke Father    Breast cancer Cousin     Allergies  Allergen Reactions   Bee Venom Swelling   Wasp Venom Swelling   Yellow Jacket Venom Swelling    Outpatient Medications Prior to Visit  Medication Sig   acetaminophen  (TYLENOL ) 500 MG tablet Take 2 tablets (1,000 mg total) by mouth every 6 (six) hours.   alendronate (FOSAMAX) 70 MG tablet Take 1 tablet by mouth once  a week   aspirin EC 81 MG tablet Take 81 mg by mouth daily. Swallow whole.   azaTHIOprine (IMURAN) 50 MG tablet Take 50 mg by mouth daily.   cholecalciferol  (VITAMIN D3) 25 MCG (1000 UNIT) tablet Take 1,000 Units by mouth daily.   colchicine  0.6 MG tablet Take 1 tablet (0.6 mg total) by mouth 2 (two) times daily.   dapagliflozin  propanediol (FARXIGA ) 10 MG TABS tablet Take 1 tablet (10 mg total) by mouth daily before breakfast.   furosemide  (LASIX ) 20 MG tablet Take 1 tablet by mouth once daily   glimepiride  (AMARYL ) 4 MG tablet Take 1 tablet by mouth twice daily   ibuprofen  (ADVIL ) 800 MG tablet Take 1 tablet (800 mg total) by mouth every 8 (eight) hours as needed.   losartan  (COZAAR ) 50 MG tablet Take 1 tablet (50 mg total) by mouth daily.   meloxicam  (MOBIC ) 15 MG tablet Take 1 tablet (15 mg total) by mouth daily.   methimazole  (TAPAZOLE ) 10 MG tablet Take 1 tablet (10 mg total) by mouth 2 (two) times daily.   ondansetron  (ZOFRAN -ODT) 4 MG disintegrating tablet Take 1 tablet (4 mg total) by mouth every 6 (six) hours as needed for nausea or vomiting.   pantoprazole  (PROTONIX ) 40 MG tablet Take 1 tablet by mouth once daily   simvastatin  (ZOCOR ) 20 MG tablet Take 1 tablet by mouth once daily   oxyCODONE  (ROXICODONE ) 5 MG immediate release tablet Take 1 tablet (5 mg total) by mouth every 8 (eight) hours as needed. (Patient not taking: Reported on 05/28/2024)   No facility-administered medications prior to visit.    Review of Systems  Constitutional: Negative.   HENT: Negative.    Eyes: Negative.   Respiratory: Negative.  Negative for cough and shortness of breath.   Cardiovascular: Negative.  Negative for chest pain, palpitations and leg swelling.  Gastrointestinal: Negative.  Negative for abdominal pain, constipation, diarrhea, heartburn, nausea and vomiting.  Genitourinary: Negative.  Negative for dysuria and flank pain.  Musculoskeletal:  Positive for joint pain. Negative for myalgias.   Skin: Negative.   Neurological: Negative.  Negative for dizziness and headaches.  Endo/Heme/Allergies: Negative.   Psychiatric/Behavioral: Negative.  Negative for depression and suicidal ideas. The patient is not nervous/anxious.        Objective:   BP 132/80   Pulse 82   Ht 5' 3 (1.6 m)   Wt 205 lb (93 kg)   SpO2 99%   BMI 36.31 kg/m   Vitals:   05/28/24 1043  BP: 132/80  Pulse: 82  Height: 5'  3 (1.6 m)  Weight: 205 lb (93 kg)  SpO2: 99%  BMI (Calculated): 36.32    Physical Exam Vitals and nursing note reviewed.  Constitutional:      Appearance: Normal appearance.  HENT:     Head: Normocephalic and atraumatic.     Nose: Nose normal.     Mouth/Throat:     Mouth: Mucous membranes are moist.     Pharynx: Oropharynx is clear.  Eyes:     Conjunctiva/sclera: Conjunctivae normal.     Pupils: Pupils are equal, round, and reactive to light.  Cardiovascular:     Rate and Rhythm: Normal rate and regular rhythm.     Pulses: Normal pulses.     Heart sounds: Normal heart sounds. No murmur heard. Pulmonary:     Effort: Pulmonary effort is normal.     Breath sounds: Normal breath sounds. No wheezing.  Abdominal:     General: Bowel sounds are normal.     Palpations: Abdomen is soft.     Tenderness: There is no abdominal tenderness. There is no right CVA tenderness or left CVA tenderness.  Musculoskeletal:        General: Normal range of motion.     Cervical back: Normal range of motion.     Right lower leg: No edema.     Left lower leg: No edema.  Skin:    General: Skin is warm and dry.  Neurological:     General: No focal deficit present.     Mental Status: She is alert and oriented to person, place, and time.  Psychiatric:        Mood and Affect: Mood normal.        Behavior: Behavior normal.      No results found for any visits on 05/28/24.  Recent Results (from the past 2160 hours)  POCT CBG (Fasting - Glucose)     Status: Abnormal   Collection Time:  02/29/24 10:12 AM  Result Value Ref Range   Glucose Fasting, POC 117 (A) 70 - 99 mg/dL  POC CREATINE & ALBUMIN,URINE     Status: Normal   Collection Time: 02/29/24 11:13 AM  Result Value Ref Range   Microalbumin Ur, POC 10 mg/L   Creatinine, POC 100 mg/dL   Albumin/Creatinine Ratio, Urine, POC <30   CMP14+EGFR     Status: Abnormal   Collection Time: 02/29/24 11:29 AM  Result Value Ref Range   Glucose 105 (H) 70 - 99 mg/dL   BUN 14 8 - 27 mg/dL   Creatinine, Ser 9.29 0.57 - 1.00 mg/dL   eGFR 95 >40 fO/fpw/8.26   BUN/Creatinine Ratio 20 12 - 28   Sodium 142 134 - 144 mmol/L   Potassium 4.4 3.5 - 5.2 mmol/L   Chloride 107 (H) 96 - 106 mmol/L   CO2 23 20 - 29 mmol/L   Calcium 9.6 8.7 - 10.3 mg/dL   Total Protein 7.0 6.0 - 8.5 g/dL   Albumin 4.1 3.9 - 4.9 g/dL   Globulin, Total 2.9 1.5 - 4.5 g/dL   Bilirubin Total 0.3 0.0 - 1.2 mg/dL   Alkaline Phosphatase 103 44 - 121 IU/L   AST 18 0 - 40 IU/L   ALT 10 0 - 32 IU/L  Lipid Profile     Status: None   Collection Time: 02/29/24 11:29 AM  Result Value Ref Range   Cholesterol, Total 136 100 - 199 mg/dL   Triglycerides 68 0 - 149 mg/dL   HDL 55 >60 mg/dL  VLDL Cholesterol Cal 14 5 - 40 mg/dL   LDL Chol Calc (NIH) 67 0 - 99 mg/dL   Chol/HDL Ratio 2.5 0.0 - 4.4 ratio    Comment:                                   T. Chol/HDL Ratio                                             Men  Women                               1/2 Avg.Risk  3.4    3.3                                   Avg.Risk  5.0    4.4                                2X Avg.Risk  9.6    7.1                                3X Avg.Risk 23.4   11.0   HgB A1c     Status: Abnormal   Collection Time: 02/29/24 11:29 AM  Result Value Ref Range   Hgb A1c MFr Bld 7.1 (H) 4.8 - 5.6 %    Comment:          Prediabetes: 5.7 - 6.4          Diabetes: >6.4          Glycemic control for adults with diabetes: <7.0    Est. average glucose Bld gHb Est-mCnc 157 mg/dL  CBC with Diff     Status:  Abnormal   Collection Time: 02/29/24 11:29 AM  Result Value Ref Range   WBC 4.9 3.4 - 10.8 x10E3/uL   RBC 5.44 (H) 3.77 - 5.28 x10E6/uL   Hemoglobin 12.1 11.1 - 15.9 g/dL   Hematocrit 59.0 65.9 - 46.6 %   MCV 75 (L) 79 - 97 fL   MCH 22.2 (L) 26.6 - 33.0 pg   MCHC 29.6 (L) 31.5 - 35.7 g/dL   RDW 85.8 88.2 - 84.5 %   Platelets 352 150 - 450 x10E3/uL   Neutrophils 61 Not Estab. %   Lymphs 26 Not Estab. %   Monocytes 10 Not Estab. %   Eos 3 Not Estab. %   Basos 0 Not Estab. %   Neutrophils Absolute 3.0 1.4 - 7.0 x10E3/uL   Lymphocytes Absolute 1.3 0.7 - 3.1 x10E3/uL   Monocytes Absolute 0.5 0.1 - 0.9 x10E3/uL   EOS (ABSOLUTE) 0.2 0.0 - 0.4 x10E3/uL   Basophils Absolute 0.0 0.0 - 0.2 x10E3/uL   Immature Granulocytes 0 Not Estab. %   Immature Grans (Abs) 0.0 0.0 - 0.1 x10E3/uL  Magnesium      Status: None   Collection Time: 02/29/24 11:29 AM  Result Value Ref Range   Magnesium  2.0 1.6 - 2.3 mg/dL  Uric acid     Status: None   Collection Time: 02/29/24 11:29  AM  Result Value Ref Range   Uric Acid 3.6 3.0 - 7.2 mg/dL    Comment:            Therapeutic target for gout patients: <6.0  TSH     Status: Abnormal   Collection Time: 02/29/24 11:29 AM  Result Value Ref Range   TSH <0.005 (L) 0.450 - 4.500 uIU/mL  Vitamin D  (25 hydroxy)     Status: None   Collection Time: 02/29/24 11:29 AM  Result Value Ref Range   Vit D, 25-Hydroxy 32.7 30.0 - 100.0 ng/mL    Comment: Vitamin D  deficiency has been defined by the Institute of Medicine and an Endocrine Society practice guideline as a level of serum 25-OH vitamin D  less than 20 ng/mL (1,2). The Endocrine Society went on to further define vitamin D  insufficiency as a level between 21 and 29 ng/mL (2). 1. IOM (Institute of Medicine). 2010. Dietary reference    intakes for calcium and D. [place]: The    [employer]. 2. Holick MF, Binkley Woodward, Bischoff-Ferrari HA, et al.    Evaluation, treatment, and prevention of vitamin  D    deficiency: an Endocrine Society clinical practice    guideline. JCEM. 2011 Jul; 96(7):1911-30.   [id number]     Status: Abnormal   Collection Time: 02/29/24 11:29 AM  Result Value Ref Range   TSH <0.005 (L) 0.450 - 4.500 uIU/mL   T3, Free 9.1 (H) 2.0 - 4.4 pg/mL   Free T4 2.62 (H) 0.82 - 1.77 ng/dL  Specimen status report     Status: None   Collection Time: 02/29/24 11:29 AM  Result Value Ref Range   specimen status report Comment     Comment: Written Authorization Written Authorization Written Authorization Received. Authorization received from Hayward Rylander 03-01-2024 Logged by Othel Gash   Anti-TPO Ab (RDL)     Status: None   Collection Time: 03/05/24  8:40 AM  Result Value Ref Range   Anti-TPO Ab (RDL) CANCELED IU/mL    Comment: Duplicate procedure ordered.  Result canceled by the ancillary.   Thyroid  antibodies (Thyroperoxidase & Thyroglobulin)     Status: Abnormal   Collection Time: 03/05/24  8:40 AM  Result Value Ref Range   Thyroperoxidase Ab SerPl-aCnc 53 (H) 0 - 34 IU/mL   Thyroglobulin Antibody <1.0 0.0 - 0.9 IU/mL    Comment: Thyroglobulin Antibody measured by [employer] Methodology It should be noted that the presence of thyroglobulin antibodies may not be pathogenic nor diagnostic, especially at very low levels. The assay manufacturer has found that four percent of individuals without evidence of thyroid  disease or autoimmunity will have positive TgAb levels up to 4 IU/mL.   POCT CBG (Fasting - Glucose)     Status: Abnormal   Collection Time: 03/18/24  1:56 PM  Result Value Ref Range   Glucose Fasting, POC 146 (A) 70 - 99 mg/dL  POCT CBG (Fasting - Glucose)     Status: Abnormal   Collection Time: 04/11/24  1:51 PM  Result Value Ref Range   Glucose Fasting, POC 105 (A) 70 - 99 mg/dL  UDY+[id number]+U6Qmzz     Status: Abnormal   Collection Time: 04/11/24  2:46 PM  Result Value Ref Range   TSH <0.005 (L) 0.450 - 4.500 uIU/mL   T3, Free 10.7  (H) 2.0 - 4.4 pg/mL   Free T4 2.75 (H) 0.82 - 1.77 ng/dL  Thyroid  antibodies (Thyroperoxidase & Thyroglobulin)     Status: Abnormal  Collection Time: 04/11/24  2:46 PM  Result Value Ref Range   Thyroperoxidase Ab SerPl-aCnc 58 (H) 0 - 34 IU/mL   Thyroglobulin Antibody <1.0 0.0 - 0.9 IU/mL    Comment: Thyroglobulin Antibody measured by [employer] Methodology It should be noted that the presence of thyroglobulin antibodies may not be pathogenic nor diagnostic, especially at very low levels. The assay manufacturer has found that four percent of individuals without evidence of thyroid  disease or autoimmunity will have positive TgAb levels up to 4 IU/mL.   Cytology - Non PAP; RIGHT LOBE THYROID      Status: None   Collection Time: 04/25/24  3:10 PM  Result Value Ref Range   CYTOLOGY - NON GYN      CYTOLOGY - NON PAP CASE: [id number] PATIENT: Judy Barber Non-Gynecological Cytology Report     Clinical History: Nodule # 3: RIGHT; Mid, Maximum size: 2.1 cm; Other 2 dimensions: 1.4 x 2.0 cm, solid/almost completely solid, hypoechoic, macrocalcifications, TI-RADS total points: 5. Specimen Submitted:  A. THYROID , RT MID, FINE NEEDLE ASPIRATION   FINAL MICROSCOPIC DIAGNOSIS: - Benign follicular nodule (Bethesda category II)  SPECIMEN ADEQUACY: Satisfactory for evaluation  GROSS: Received is/are 30 cc's of clear, pale pink cytolyt solution. ([facility]:emh) Prepared: Smears:  0 Concentration Method (Thin Prep):  1 Cell Block: Cell block attempted, not obtained. Additional Studies: Afirma collected.     Final Diagnosis performed by Ilsa Pottier, MD.   Electronically signed 05/01/2024 Technical component performed at [facility]. [facility], [address], [place], [place] [address].  Professional component performed at [facility], [address] [place], [place] [address].  Immunohistochemistry Technical component (if applicable) was performed at  [facility]. [address], [place], [place] [address].   IMMUNOHISTOCHEMISTRY DISCLAIMER (if applicable): Some of these immunohistochemical stains may have been developed and the performance characteristics determine by [facility]. Some may not have been cleared or approved by the U.S. Food and Drug Administration. The FDA has determined that such clearance or approval is not necessary. This test is used for clinical purposes. It should not be regarded as investigational or for research. This laboratory is certified under the Clinical Laboratory Improvement Amendments of 1988 (CLIA-88) as qualified to perform high complexity clinical laboratory testing.  The controls stained appropriately.   IHC stains are performed on formalin fixed, paraffin embedded tissue using a 3,3diaminobenzidine (DAB) chromogen and Leica  Bond Autostainer System. The staining intensity of the nucleus is score manually and is reported as the percentage of tumor cell nuclei demonstrating specific nuclear staining. The specimens are fixed in 10% Neutral Formalin for at least 6 hours and up to 72hrs. These tests are validated on decalcified tissue. Results should be interpreted with caution given the possibility of false negative results on decalcified specimens. Antibody Clones are as follows ER-clone [id number], PR-clone 16, Ki67- clone MM1. Some of these immunohistochemical stains may have been developed and the performance characteristics determined by [facility].   Basic metabolic panel     Status: Abnormal   Collection Time: 04/30/24  8:22 PM  Result Value Ref Range   Sodium 137 135 - 145 mmol/L   Potassium 3.5 3.5 - 5.1 mmol/L   Chloride 107 98 - 111 mmol/L   CO2 22 22 - 32 mmol/L   Glucose, Bld 201 (H) 70 - 99 mg/dL    Comment: Glucose reference range applies only to samples taken after fasting for at least 8 hours.  BUN 12 8 - 23 mg/dL   Creatinine, Ser  9.15 0.44 - 1.00 mg/dL   Calcium 9.0 8.9 - 89.6 mg/dL   GFR, Estimated >39 >39 mL/min    Comment: (NOTE) Calculated using the CKD-EPI Creatinine Equation (2021)    Anion gap 8 5 - 15    Comment: Performed at [facility], [address] [place], [place] [address]  CBC     Status: Abnormal   Collection Time: 04/30/24  8:22 PM  Result Value Ref Range   WBC 4.9 4.0 - 10.5 K/uL   RBC 5.52 (H) 3.87 - 5.11 MIL/uL   Hemoglobin 12.4 12.0 - 15.0 g/dL   HCT 59.3 63.9 - 53.9 %   MCV 73.6 (L) 80.0 - 100.0 fL   MCH 22.5 (L) 26.0 - 34.0 pg   MCHC 30.5 30.0 - 36.0 g/dL   RDW 86.0 88.4 - 84.4 %   Platelets 373 150 - 400 K/uL   nRBC 0.0 0.0 - 0.2 %    Comment: Performed at [facility], [address], [place], [place] [address]  Troponin I (High Sensitivity)     Status: None   Collection Time: 04/30/24  8:22 PM  Result Value Ref Range   Troponin I (High Sensitivity) 6 <18 ng/L    Comment: (NOTE) Elevated high sensitivity troponin I (hsTnI) values and significant  changes across serial measurements may suggest ACS but many other  chronic and acute conditions are known to elevate hsTnI results.  Refer to the Links section for chest pain algorithms and additional  guidance. Performed at [facility], [address] [place], [place] [address]   Troponin I (High Sensitivity)     Status: None   Collection Time: 04/30/24 10:22 PM  Result Value Ref Range   Troponin I (High Sensitivity) 8 <18 ng/L    Comment: (NOTE) Elevated high sensitivity troponin I (hsTnI) values and significant  changes across serial measurements may suggest ACS but many other  chronic and acute conditions are known to elevate hsTnI results.  Refer to the Links section for chest pain algorithms and additional  guidance. Performed at [facility], [address] [place], [place] [address]   D-dimer, quantitative     Status: None   Collection Time: 04/30/24 11:27 PM  Result  Value Ref Range   D-Dimer, Quant 0.29 0.00 - 0.50 ug/mL-FEU    Comment: (NOTE) At the manufacturer cut-off value of 0.5 g/mL FEU, this assay has a negative predictive value of 95-100%.This assay is intended for use in conjunction with a clinical pretest probability (PTP) assessment model to exclude pulmonary embolism (PE) and deep venous thrombosis (DVT) in outpatients suspected of PE or DVT. Results should be correlated with clinical presentation. Performed at [facility], [address] [place], [place] [address]   TSH     Status: Abnormal   Collection Time: 05/17/24  2:19 PM  Result Value Ref Range   TSH <0.01 (L) 0.40 - 4.50 mIU/L  T3, free     Status: None   Collection Time: 05/17/24  2:19 PM  Result Value Ref Range   T3, Free 3.9 2.3 - 4.2 pg/mL  T4, free     Status: None   Collection Time: 05/17/24  2:19 PM  Result Value Ref Range   Free T4 1.4 0.8 - 1.8 ng/dL  TRAb (TSH Receptor Binding Antibody)     Status: Abnormal   Collection Time: 05/17/24  2:19 PM  Result Value  Ref Range   TRAB 14.69 (H) <=2.00 IU/L    Comment: . This test was performed using the TRAb Antibody ELISA method which is standardized against the 1st International Standard 90/672 and is reported in International Units (IU/L). The reference range reported was established specifically for this test method. .   Thyroid  stimulating immunoglobulin     Status: Abnormal   Collection Time: 05/17/24  2:19 PM  Result Value Ref Range   TSI 192 (H) <140 % baseline    Comment: . Thyroid  stimulating immunoglobulins (TSI) can engage the TSH receptors resulting in hyperthyroidism in Graves' disease patients. TSI levels can be useful in monitoring the clinical outcome of Graves' disease as well as assessing the potential for hyperthyroidism from maternal-fetal transfer. TSI results greater than or equal to (>=) 140% of the Reference Control are considered positive. SABRA NOTE: A serum TSH level  greater than 350 micro-International Units/mL can interfere with the TSI bioassay and potentially give false positive results. . Patients who are pregnant and are suspected of having hyperthyroidism should have both TSI and human Chorionic Gonadotropin(hCG) tests measured. A serum hCG level greater than 40,625 mIU/mL can interfere with the TSI bioassay and may give false negative results. In these patients it is recommended that a second TSI be obtained when the hCG concentration falls below 40,625 mIU/mL (usually after approximately 20-weeks gestation) . SABRA The analytical performance characteristics of this assay have been determined by [facility], [place], [place].  The modifications have not been cleared or approved by the FDA.  This assay has been validated pursuant to the CLIA regulations and is used for clinical purposes. .       Assessment & Plan:  Continue taking medications as prescribed, Continue following up with endocrinology. Endocrinology to check her thyroid  labs 3 months from prior visit, routine labs ordered. Problem List Items Addressed This Visit     Essential hypertension, benign - Primary   Relevant Orders   CBC with Diff   CMP14+EGFR   Sarcoidosis of lung (HCC)   Type 2 diabetes mellitus with hyperglycemia, without long-term current use of insulin  (HCC)   Relevant Orders   Hemoglobin A1c   Mixed hyperlipidemia   Relevant Orders   Lipid Panel w/o Chol/HDL Ratio   Other Visit Diagnoses       Hyperthyroidism         Gastroesophageal reflux disease without esophagitis           Return in about 3 months (around 08/28/2024).   Total time spent: 30 minutes  FERNAND FREDY RAMAN, MD  05/28/2024   This document may have been prepared by [facility] Voice Recognition software and as such may include unintentional dictation errors.

## 2024-07-09 ENCOUNTER — Ambulatory Visit: Admitting: Cardiology

## 2024-07-09 ENCOUNTER — Encounter: Payer: Self-pay | Admitting: Cardiology

## 2024-07-09 ENCOUNTER — Other Ambulatory Visit: Payer: Self-pay

## 2024-07-09 VITALS — BP 134/64 | HR 63 | Ht 63.0 in | Wt 209.0 lb

## 2024-07-09 DIAGNOSIS — Z131 Encounter for screening for diabetes mellitus: Secondary | ICD-10-CM | POA: Diagnosis not present

## 2024-07-09 DIAGNOSIS — Z0130 Encounter for examination of blood pressure without abnormal findings: Secondary | ICD-10-CM | POA: Diagnosis not present

## 2024-07-09 DIAGNOSIS — S8011XA Contusion of right lower leg, initial encounter: Secondary | ICD-10-CM | POA: Diagnosis not present

## 2024-07-09 DIAGNOSIS — W57XXXA Bitten or stung by nonvenomous insect and other nonvenomous arthropods, initial encounter: Secondary | ICD-10-CM | POA: Diagnosis not present

## 2024-07-09 MED ORDER — GLIMEPIRIDE 4 MG PO TABS: 4.0000 mg | ORAL_TABLET | Freq: Two times a day (BID) | ORAL | 3 refills | Status: AC

## 2024-07-09 MED ORDER — ALENDRONATE SODIUM 70 MG PO TABS: 70.0000 mg | ORAL_TABLET | ORAL | 3 refills | Status: AC

## 2024-07-09 MED ORDER — TRIAMCINOLONE ACETONIDE 0.5 % EX OINT: 1.0000 | TOPICAL_OINTMENT | Freq: Two times a day (BID) | CUTANEOUS | 0 refills | Status: AC

## 2024-07-09 NOTE — Progress Notes (Signed)
 Established Patient Office Visit  Subjective:  Patient ID: EARMA Judy Barber, female    DOB: [birth year]  Age: 68 y.o. MRN: [id number]  Chief Complaint  Patient presents with   Acute Visit    Bruise on the inside of right leg has mostly diminish claims to take a long time to heal.    Patient in office for an acute visit, complains of a bruise on the inside of her right leg. On exam, appears to be a bug bite inside of right knee, minimal bruising. Patient and daughter report the area has improved. Will send in hydrocortisone cream.     No other concerns at this time.   Past Medical History:  Diagnosis Date   Diabetes mellitus type 2, controlled (HCC)    Heart murmur    History of atrial fibrillation    a.) s/p cardiac ablation 2005; no longer sees cardiology; no interventions for rate control or chronic anticoagulation therapy at this point   History of kidney stones    Hyperlipidemia    Hypertension    Osteoarthritis of both knees    Pneumonia    Pulmonary sarcoidosis    Sleep apnea    has never received cpap   Vitamin D  deficiency     Past Surgical History:  Procedure Laterality Date   APPENDECTOMY  1980   BREAST BIOPSY Left 2017   benign   BREAST EXCISIONAL BIOPSY Right 2013   benign   CARDIAC ELECTROPHYSIOLOGY MAPPING AND ABLATION  2005   COLONOSCOPY  2019   KNEE ARTHROSCOPY W/ MENISCECTOMY Right 03/01/2017   LUNG BIOPSY  2005   sarcoidosis   PARTIAL KNEE ARTHROPLASTY Left 02/17/2022   Procedure: UNICOMPARTMENTAL KNEE;  Surgeon: Edie Norleen PARAS, MD;  Location: [facility] OR[facility];  Service: Orthopedics;  Laterality: Left;   STRABISMUS SURGERY Bilateral    as a child   TOTAL KNEE ARTHROPLASTY Right 12/06/2022   Procedure: TOTAL KNEE ARTHROPLASTY;  Surgeon: Edie Norleen PARAS, MD;  Location: [facility] OR[facility];  Service: Orthopedics;  Laterality: Right;   TUBAL LIGATION      Social History   Socioeconomic History   Marital status: Divorced    Spouse name: Not on file   Number of children:  0   Years of education: Not on file   Highest education level: Not on file  Occupational History   Not on file  Tobacco Use   Smoking status: Former    Current packs/day: 0.00    Average packs/day: 0.5 packs/day for 25.0 years (12.5 ttl pk-yrs)    Types: Cigarettes    Start date: 75    Quit date: 2005    Years since quitting: 20.7   Smokeless tobacco: Never  Vaping Use   Vaping status: Never Used  Substance and Sexual Activity   Alcohol use: Not Currently   Drug use: Never   Sexual activity: Not on file  Other Topics Concern   Not on file  Social History Narrative   Lives with niece   Social Drivers of Health   Financial Resource Strain: Low Risk  (06/07/2024)   Received from [facility]   Overall Financial Resource Strain (CARDIA)    Difficulty of Paying Living Expenses: Not hard at all  Food Insecurity: No Food Insecurity (06/07/2024)   Received from [facility]   Hunger Vital Sign    Within the past 12 months, you worried that your food would run out before you got the money to buy more.: Never  true    Within the past 12 months, the food you bought just didn't last and you didn't have money to get more.: Never true  Transportation Needs: No Transportation Needs (06/07/2024)   Received from [facility] - Transportation    In the past 12 months, has lack of transportation kept you from medical appointments or from getting medications?: No    Lack of Transportation (Non-Medical): No  Physical Activity: Not on file  Stress: Not on file  Social Connections: Not on file  Intimate Partner Violence: Not At Risk (12/06/2022)   Humiliation, Afraid, Rape, and Kick questionnaire    Fear of Current or Ex-Partner: No    Emotionally Abused: No    Physically Abused: No    Sexually Abused: No    Family History  Problem Relation Age of Onset   Heart attack Mother    Stroke Father    Breast cancer Cousin      Allergies  Allergen Reactions   Bee Venom Swelling   Wasp Venom Swelling   Yellow Jacket Venom Swelling    Outpatient Medications Prior to Visit  Medication Sig   acetaminophen  (TYLENOL ) 500 MG tablet Take 2 tablets (1,000 mg total) by mouth every 6 (six) hours.   alendronate (FOSAMAX) 70 MG tablet Take 1 tablet by mouth once a week   aspirin EC 81 MG tablet Take 81 mg by mouth daily. Swallow whole.   azaTHIOprine (IMURAN) 50 MG tablet Take 50 mg by mouth daily.   cholecalciferol  (VITAMIN D3) 25 MCG (1000 UNIT) tablet Take 1,000 Units by mouth daily.   dapagliflozin  propanediol (FARXIGA ) 10 MG TABS tablet Take 1 tablet (10 mg total) by mouth daily before breakfast.   glimepiride  (AMARYL ) 4 MG tablet Take 1 tablet by mouth twice daily   ibuprofen  (ADVIL ) 800 MG tablet Take 1 tablet (800 mg total) by mouth every 8 (eight) hours as needed.   losartan  (COZAAR ) 50 MG tablet Take 1 tablet (50 mg total) by mouth daily.   meloxicam  (MOBIC ) 15 MG tablet Take 1 tablet (15 mg total) by mouth daily.   methimazole  (TAPAZOLE ) 10 MG tablet Take 1 tablet (10 mg total) by mouth 2 (two) times daily.   pantoprazole  (PROTONIX ) 40 MG tablet Take 1 tablet by mouth once daily   simvastatin  (ZOCOR ) 20 MG tablet Take 1 tablet by mouth once daily   colchicine  0.6 MG tablet Take 1 tablet (0.6 mg total) by mouth 2 (two) times daily. (Patient not taking: Reported on 07/09/2024)   furosemide  (LASIX ) 20 MG tablet Take 1 tablet by mouth once daily (Patient not taking: Reported on 07/09/2024)   ondansetron  (ZOFRAN -ODT) 4 MG disintegrating tablet Take 1 tablet (4 mg total) by mouth every 6 (six) hours as needed for nausea or vomiting. (Patient not taking: Reported on 07/09/2024)   oxyCODONE  (ROXICODONE ) 5 MG immediate release tablet Take 1 tablet (5 mg total) by mouth every 8 (eight) hours as needed. (Patient not taking: Reported on 07/09/2024)   No facility-administered medications prior to visit.    Review of  Systems  Constitutional: Negative.   HENT: Negative.    Eyes: Negative.   Respiratory: Negative.  Negative for shortness of breath.   Cardiovascular: Negative.  Negative for chest pain.  Gastrointestinal: Negative.  Negative for abdominal pain, constipation and diarrhea.  Genitourinary: Negative.   Musculoskeletal:  Negative for joint pain and myalgias.  Skin: Negative.   Neurological: Negative.  Negative for dizziness and headaches.  Endo/Heme/Allergies: Negative.   All other systems reviewed and are negative.      Objective:   BP 134/64   Pulse 63   Ht 5' 3 (1.6 m)   Wt 209 lb (94.8 kg)   SpO2 99%   BMI 37.02 kg/m   Vitals:   07/09/24 1323  BP: 134/64  Pulse: 63  Height: 5' 3 (1.6 m)  Weight: 209 lb (94.8 kg)  SpO2: 99%  BMI (Calculated): 37.03    Physical Exam Vitals and nursing note reviewed.  Constitutional:      Appearance: Normal appearance. She is normal weight.  HENT:     Head: Normocephalic and atraumatic.     Nose: Nose normal.     Mouth/Throat:     Mouth: Mucous membranes are moist.  Eyes:     Extraocular Movements: Extraocular movements intact.     Conjunctiva/sclera: Conjunctivae normal.     Pupils: Pupils are equal, round, and reactive to light.  Cardiovascular:     Rate and Rhythm: Normal rate and regular rhythm.     Pulses: Normal pulses.     Heart sounds: Normal heart sounds.  Pulmonary:     Effort: Pulmonary effort is normal.     Breath sounds: Normal breath sounds.  Abdominal:     General: Abdomen is flat. Bowel sounds are normal.     Palpations: Abdomen is soft.  Musculoskeletal:        General: Normal range of motion.     Cervical back: Normal range of motion.  Skin:    General: Skin is warm and dry.      Neurological:     General: No focal deficit present.     Mental Status: She is alert and oriented to person, place, and time.  Psychiatric:        Mood and Affect: Mood normal.        Behavior: Behavior normal.         Thought Content: Thought content normal.        Judgment: Judgment normal.      No results found for any visits on 07/09/24.  Recent Results (from the past 2160 hours)  POCT CBG (Fasting - Glucose)     Status: Abnormal   Collection Time: 04/11/24  1:51 PM  Result Value Ref Range   Glucose Fasting, POC 105 (A) 70 - 99 mg/dL  [id number]     Status: Abnormal   Collection Time: 04/11/24  2:46 PM  Result Value Ref Range   TSH <0.005 (L) 0.450 - 4.500 uIU/mL   T3, Free 10.7 (H) 2.0 - 4.4 pg/mL   Free T4 2.75 (H) 0.82 - 1.77 ng/dL  Thyroid  antibodies (Thyroperoxidase & Thyroglobulin)     Status: Abnormal   Collection Time: 04/11/24  2:46 PM  Result Value Ref Range   Thyroperoxidase Ab SerPl-aCnc 58 (H) 0 - 34 IU/mL   Thyroglobulin Antibody <1.0 0.0 - 0.9 IU/mL    Comment: Thyroglobulin Antibody measured by [employer] Coulter Methodology It should be noted that the presence of thyroglobulin antibodies may not be pathogenic nor diagnostic, especially at very low levels. The assay manufacturer has found that four percent of individuals without evidence of thyroid  disease or autoimmunity will have positive TgAb levels up to 4 IU/mL.   Cytology - Non PAP; RIGHT LOBE THYROID      Status: None   Collection Time: 04/25/24  3:10 PM  Result Value Ref Range   CYTOLOGY - NON GYN  CYTOLOGY - NON PAP CASE: [id number] PATIENT: Judy Barber Non-Gynecological Cytology Report     Clinical History: Nodule # 3: RIGHT; Mid, Maximum size: 2.1 cm; Other 2 dimensions: 1.4 x 2.0 cm, solid/almost completely solid, hypoechoic, macrocalcifications, TI-RADS total points: 5. Specimen Submitted:  A. THYROID , RT MID, FINE NEEDLE ASPIRATION   FINAL MICROSCOPIC DIAGNOSIS: - Benign follicular nodule (Bethesda category II)  SPECIMEN ADEQUACY: Satisfactory for evaluation  GROSS: Received is/are 30 cc's of clear, pale pink cytolyt solution. ([facility]:emh) Prepared: Smears:  0 Concentration Method (Thin  Prep):  1 Cell Block: Cell block attempted, not obtained. Additional Studies: Afirma collected.     Final Diagnosis performed by Ilsa Pottier, MD.   Electronically signed 05/01/2024 Technical component performed at [facility]. [facility], [address], [place], [place] [address].  Professional component performed at [facility], [address], [place], [place] [address].  Immunohistochemistry Technical component (if applicable) was performed at [facility]. [address], [place], [place] [address].   IMMUNOHISTOCHEMISTRY DISCLAIMER (if applicable): Some of these immunohistochemical stains may have been developed and the performance characteristics determine by [facility]. Some may not have been cleared or approved by the U.S. Food and Drug Administration. The FDA has determined that such clearance or approval is not necessary. This test is used for clinical purposes. It should not be regarded as investigational or for research. This laboratory is certified under the Clinical Laboratory Improvement Amendments of 1988 (CLIA-88) as qualified to perform high complexity clinical laboratory testing.  The controls stained appropriately.   IHC stains are performed on formalin fixed, paraffin embedded tissue using a 3,3diaminobenzidine (DAB) chromogen and Leica  Bond Autostainer System. The staining intensity of the nucleus is score manually and is reported as the percentage of tumor cell nuclei demonstrating specific nuclear staining. The specimens are fixed in 10% Neutral Formalin for at least 6 hours and up to 72hrs. These tests are validated on decalcified tissue. Results should be interpreted with caution given the possibility of false negative results on decalcified specimens. Antibody Clones are as follows ER-clone 26F, PR-clone 16, Ki67- clone MM1. Some of these immunohistochemical stains may have been  developed and the performance characteristics determined by [facility].   Basic metabolic panel     Status: Abnormal   Collection Time: 04/30/24  8:22 PM  Result Value Ref Range   Sodium 137 135 - 145 mmol/L   Potassium 3.5 3.5 - 5.1 mmol/L   Chloride 107 98 - 111 mmol/L   CO2 22 22 - 32 mmol/L   Glucose, Bld 201 (H) 70 - 99 mg/dL    Comment: Glucose reference range applies only to samples taken after fasting for at least 8 hours.   BUN 12 8 - 23 mg/dL   Creatinine, Ser 9.15 0.44 - 1.00 mg/dL   Calcium 9.0 8.9 - 89.6 mg/dL   GFR, Estimated >39 >39 mL/min    Comment: (NOTE) Calculated using the CKD-EPI Creatinine Equation (2021)    Anion gap 8 5 - 15    Comment: Performed at [facility], [address] [place], [place] [address]  CBC     Status: Abnormal   Collection Time: 04/30/24  8:22 PM  Result Value Ref Range   WBC 4.9 4.0 - 10.5 K/uL   RBC 5.52 (H) 3.87 - 5.11 MIL/uL   Hemoglobin 12.4 12.0 - 15.0 g/dL   HCT 59.3 63.9 - 53.9 %   MCV 73.6 (L)  80.0 - 100.0 fL   MCH 22.5 (L) 26.0 - 34.0 pg   MCHC 30.5 30.0 - 36.0 g/dL   RDW 86.0 88.4 - 84.4 %   Platelets 373 150 - 400 K/uL   nRBC 0.0 0.0 - 0.2 %    Comment: Performed at [facility], [address] [place], [place] [address]  Troponin I (High Sensitivity)     Status: None   Collection Time: 04/30/24  8:22 PM  Result Value Ref Range   Troponin I (High Sensitivity) 6 <18 ng/L    Comment: (NOTE) Elevated high sensitivity troponin I (hsTnI) values and significant  changes across serial measurements may suggest ACS but many other  chronic and acute conditions are known to elevate hsTnI results.  Refer to the Links section for chest pain algorithms and additional  guidance. Performed at [facility], [address] [place], [place] [address]   Troponin I (High Sensitivity)     Status: None   Collection Time: 04/30/24 10:22 PM  Result Value Ref Range   Troponin I (High  Sensitivity) 8 <18 ng/L    Comment: (NOTE) Elevated high sensitivity troponin I (hsTnI) values and significant  changes across serial measurements may suggest ACS but many other  chronic and acute conditions are known to elevate hsTnI results.  Refer to the Links section for chest pain algorithms and additional  guidance. Performed at [facility], [address] [place], [place] [address]   D-dimer, quantitative     Status: None   Collection Time: 04/30/24 11:27 PM  Result Value Ref Range   D-Dimer, Quant 0.29 0.00 - 0.50 ug/mL-FEU    Comment: (NOTE) At the manufacturer cut-off value of 0.5 g/mL FEU, this assay has a negative predictive value of 95-100%.This assay is intended for use in conjunction with a clinical pretest probability (PTP) assessment model to exclude pulmonary embolism (PE) and deep venous thrombosis (DVT) in outpatients suspected of PE or DVT. Results should be correlated with clinical presentation. Performed at [facility], [address] [place], [place] [address]   TSH     Status: Abnormal   Collection Time: 05/17/24  2:19 PM  Result Value Ref Range   TSH <0.01 (L) 0.40 - 4.50 mIU/L  T3, free     Status: None   Collection Time: 05/17/24  2:19 PM  Result Value Ref Range   T3, Free 3.9 2.3 - 4.2 pg/mL  T4, free     Status: None   Collection Time: 05/17/24  2:19 PM  Result Value Ref Range   Free T4 1.4 0.8 - 1.8 ng/dL  TRAb (TSH Receptor Binding Antibody)     Status: Abnormal   Collection Time: 05/17/24  2:19 PM  Result Value Ref Range   TRAB 14.69 (H) <=2.00 IU/L    Comment: . This test was performed using the TRAb Antibody ELISA method which is standardized against the 1st International Standard 90/672 and is reported in International Units (IU/L). The reference range reported was established specifically for this test method. .   Thyroid  stimulating immunoglobulin     Status: Abnormal   Collection Time: 05/17/24  2:19  PM  Result Value Ref Range   TSI 192 (H) <140 % baseline    Comment: . Thyroid  stimulating immunoglobulins (TSI) can engage the TSH receptors resulting in hyperthyroidism in Graves' disease patients. TSI levels can be useful in monitoring the clinical outcome of Graves' disease as well as assessing the potential for hyperthyroidism  from maternal-fetal transfer. TSI results greater than or equal to (>=) 140% of the Reference Control are considered positive. SABRA NOTE: A serum TSH level greater than 350 micro-International Units/mL can interfere with the TSI bioassay and potentially give false positive results. . Patients who are pregnant and are suspected of having hyperthyroidism should have both TSI and human Chorionic Gonadotropin(hCG) tests measured. A serum hCG level greater than 40,625 mIU/mL can interfere with the TSI bioassay and may give false negative results. In these patients it is recommended that a second TSI be obtained when the hCG concentration falls below 40,625 mIU/mL (usually after approximately 20-weeks gestation) . SABRA The analytical performance characteristics of this assay have been determined by [facility], [place], [place].  The modifications have not been cleared or approved by the FDA.  This assay has been validated pursuant to the CLIA regulations and is used for clinical purposes. .       Assessment & Plan:  Hydrocortisone cream on bug bite  Problem List Items Addressed This Visit       Other   Bug bite - Primary    Return if symptoms worsen or fail to improve, for as scheduled.   Total time spent: 25 minutes  [employer], NP  07/09/2024   This document may have been prepared by Dragon Voice Recognition software and as such may include unintentional dictation errors.

## 2024-08-12 ENCOUNTER — Other Ambulatory Visit

## 2024-08-13 LAB — T3, FREE: T3, Free: 2.3 pg/mL (ref 2.3–4.2)

## 2024-08-13 LAB — T4, FREE: Free T4: 0.7 ng/dL — ABNORMAL LOW (ref 0.8–1.8)

## 2024-08-13 LAB — TSH: TSH: 1.44 m[IU]/L (ref 0.40–4.50)

## 2024-08-15 ENCOUNTER — Ambulatory Visit (INDEPENDENT_AMBULATORY_CARE_PROVIDER_SITE_OTHER): Admitting: "Endocrinology

## 2024-08-15 ENCOUNTER — Encounter: Payer: Self-pay | Admitting: "Endocrinology

## 2024-08-15 VITALS — BP 136/80 | HR 84 | Ht 63.0 in | Wt 211.0 lb

## 2024-08-15 DIAGNOSIS — E042 Nontoxic multinodular goiter: Secondary | ICD-10-CM

## 2024-08-15 DIAGNOSIS — E0500 Thyrotoxicosis with diffuse goiter without thyrotoxic crisis or storm: Secondary | ICD-10-CM | POA: Diagnosis not present

## 2024-08-15 NOTE — Progress Notes (Signed)
 Outpatient Endocrinology Note Judy Birmingham, MD  08/15/24  [facility] CHRISTELLA Pounds 04/27/1956 [id number]  Referring Provider: Fernand Fredy RAMAN, MD Primary Care Provider: Fernand Fredy RAMAN, MD Subjective  No chief complaint on file.  Assessment & Plan  Diagnoses and all orders for this visit:  Graves disease -     TSH -     T3, free -     T4, free     Dontavia M Hobart is currently taking methimazole  10 mg twice a day. Patient was biochemically hyperthyroid on last few labs.  Discussed the etiology for hyperthyroidism. Educated on thyroid  axis.  Recommend the following: continue methimazole  10 + 5  a day. Repeat labs in 3 months or sooner if symptoms of hyper or hypothyroidism develop.  Educated on definitive options of treatment including RAI therapy and surgery. Patient does not want RAI at this time.  Counseled on: -complications of untreated hyperthyroidism including atrial fibrillation, heart failure and osteoporosis -side effects of Methimazole  including but not limited to allergic reaction, rash, bone marrow suppression, liver dysfunction and teratogenic potential -implications in pregnancy and breastfeeding -compliance and follow up needs    03/28/24 THYROID  SCAN AND UPTAKE - 4 AND 24 HOURS reported multinodular goiter with prominent pyramidal lobe, similar to prior examination. Increased 4 and 24 hour thyroid  uptake, new since prior examination. 03/08/24 thyroid  U/S reported Multinodular goiter.  Asymmetrically smaller LEFT thyroid  lobe. Nodule 3 (TI-RADS 4), measuring 2.1 cm, located in the mid RIGHT thyroid  lobe, meets criteria for FNA. Nodule 2 (TI-RADS 4), measuring 1.0 cm, located in the superior RIGHT thyroid  lobe meets criteria for imaging follow-up. Annual ultrasound surveillance is recommended until 5 years of stability is documented. 1.5 x 0.5 x 1.2 cm oval isoechoic structure seen in the RIGHT neck, possibly related to a lymph node. Recommend follow-up ultrasound soft tissue  neck in 6 months to document stability of this structure. 03/28/24: RAI SCAN: Multinodular goiter with prominent pyramidal lobe, similar to prior examination. Increased 4 and 24 hour thyroid  uptake, new since prior examination. 04/25/24: Nodule # 3: RIGHT; Mid, Maximum size: 2.1 cm; Other 2  dimensions: 1.4 x 2.0 cm, solid/almost completely solid, hypoechoic,  macrocalcifications, TI-RADS total points: 5  - Benign follicular nodule (Bethesda category II)  ORDERED F/U U/S THYROID  TO F/U ON 1.5 x 0.5 x 1.2 cm oval isoechoic structure seen in the RIGHT neck, possibly related to a lymph node  I have reviewed current medications, nurse's notes, allergies, vital signs, past medical and surgical history, family medical history, and social history for this encounter. Counseled patient on symptoms, examination findings, lab findings, imaging results, treatment decisions and monitoring and prognosis. The patient understood the recommendations and agrees with the treatment plan. All questions regarding treatment plan were fully answered.   Return in about 2 months (around 10/15/2024) for visit + labs before next visit.   Judy Birmingham, MD  08/15/24   I have reviewed current medications, nurse's notes, allergies, vital signs, past medical and surgical history, family medical history, and social history for this encounter. Counseled patient on symptoms, examination findings, lab findings, imaging results, treatment decisions and monitoring and prognosis. The patient understood the recommendations and agrees with the treatment plan. All questions regarding treatment plan were fully answered.   History of Present Illness Judy Barber is a 68 y.o. year old female who presents to our clinic with hyperthyroidism diagnosed in 2025.    Was on some thyroid  medication in 2008, doesn't remember name or  when it was stopped.    Symptoms suggestive of HYPOTHYROIDISM:     fatigue No weight gain Yes cold intolerance   No constipation  No  Symptoms suggestive of HYPERTHYROIDISM:  Weight loss Yes, few lbs Heat intolerance No Constipation  No palpitations  No excess sweating yes, improved   Compressive symptoms:  dysphagia  No dysphonia  No positional dyspnea (especially with simultaneous arms elevation)  No  Smokes  No On biotin  No Personal history of head/neck surgery/irradiation  No  Grave's Ophthalmopathy Clinical Activity Score: 0/9  Adverse Drug Effects from Methimazole  (MMI): rash No fever No throat pain No arthritis No mouth ulcers No jaundice No loss of appetite No lymphadenopathy No   03/08/24 THYROID  ULTRASOUND   TECHNIQUE: Ultrasound examination of the thyroid  gland and adjacent soft tissues was performed.   COMPARISON:  None available   FINDINGS: Parenchymal Echotexture: Moderately heterogenous   Isthmus: 0.7 cm   Right lobe: 5.1 x 2.3 x 2.4 cm   Left lobe: 2.5 x 0.9 x 1.2 cm   _________________________________________________________   Estimated total number of nodules >/= 1 cm: 3   Number of spongiform nodules >/=  2 cm not described below (TR1): 0   Number of mixed cystic and solid nodules >/= 1.5 cm not described below (TR2): 0   _________________________________________________________   Nodule # 1:   Location: Isthmus; RIGHT   Maximum size: 1.2 cm; Other 2 dimensions: 0.5 x 1.2 cm   Composition: mixed cystic and solid (1)   Echogenicity: isoechoic (1)   Shape: not taller-than-wide (0)   Margins: ill-defined (0)   Echogenic foci: none (0)   ACR TI-RADS total points: 2.   ACR TI-RADS risk category: TR2 (2 points).   ACR TI-RADS recommendations:   This nodule does NOT meet TI-RADS criteria for biopsy or dedicated follow-up.   _________________________________________________________   Nodule # 2:   Location: RIGHT; superior   Maximum size: 1.0 cm; Other 2 dimensions: 0.5 x 1.0 cm   Composition: solid/almost completely solid  (2)   Echogenicity: hypoechoic (2)   Shape: not taller-than-wide (0)   Margins: smooth (0)   Echogenic foci: none (0)   ACR TI-RADS total points: 4.   ACR TI-RADS risk category: TR4 (4-6 points).   ACR TI-RADS recommendations:   *Given size (>/= 1 - 1.4 cm) and appearance, a follow-up ultrasound in 1 year should be considered based on TI-RADS criteria.   _________________________________________________________   Nodule # 3:   Location: RIGHT; Mid   Maximum size: 2.1 cm; Other 2 dimensions: 1.4 x 2.0 cm   Composition: solid/almost completely solid (2)   Echogenicity: hypoechoic (2)   Shape: not taller-than-wide (0)   Margins: smooth (0)   Echogenic foci: macrocalcifications (1)   ACR TI-RADS total points: 5.   ACR TI-RADS risk category: TR4 (4-6 points).   ACR TI-RADS recommendations:   **Given size (>/= 1.5 cm) and appearance, fine needle aspiration of this moderately suspicious nodule should be considered based on TI-RADS criteria.   _________________________________________________________   1.5 x 0.5 cm oval isoechoic structure in the RIGHT neck, possibly related to a nonenlarged lymph node.   IMPRESSION: 1. Multinodular goiter.  Asymmetrically smaller LEFT thyroid  lobe. 2. Nodule 3 (TI-RADS 4), measuring 2.1 cm, located in the mid RIGHT thyroid  lobe, meets criteria for FNA. 3. Nodule 2 (TI-RADS 4), measuring 1.0 cm, located in the superior RIGHT thyroid  lobe meets criteria for imaging follow-up. Annual ultrasound surveillance is recommended until 5 years of  stability is documented. 4. 1.5 x 0.5 x 1.2 cm oval isoechoic structure seen in the RIGHT neck, possibly related to a lymph node. Recommend follow-up ultrasound soft tissue neck in 6 months to document stability of this structure.  ______________ 03/28/24 THYROID  SCAN AND UPTAKE - 4 AND 24 HOURS   TECHNIQUE: Following oral administration of I-123 capsule, anterior planar imaging was  acquired at 24 hours. Thyroid  uptake was calculated with a thyroid  probe at 4-6 hours and 24 hours.   RADIOPHARMACEUTICALS:  433.7 uCi I-123 sodium iodide p.o.   COMPARISON:  12/19/2006   FINDINGS: Similar to prior examination, the left thyroid  lobe is asymmetrically smaller than the right and a prominent pyramidal lobe is identified. Heterogeneity of uptake suggests an underlying multinodular goiter.   4 hour I-123 uptake = 28.7% (normal 5-20%)   24 hour I-123 uptake = 69.4% (normal 10-30%)   IMPRESSION: Multinodular goiter with prominent pyramidal lobe, similar to prior examination.   Increased 4 and 24 hour thyroid  uptake, new since prior examination.   Physical Exam  BP 136/80   Pulse 84   Ht 5' 3 (1.6 m)   Wt 211 lb (95.7 kg)   SpO2 98%   BMI 37.38 kg/m  Constitutional: well developed, well nourished Head: normocephalic, atraumatic, no exophthalmos Eyes: sclera anicteric, no redness Neck: + nodule, no thyromegaly noted Lungs: normal respiratory effort Neurology: alert and oriented, no fine hand tremor Skin: dry, no appreciable rashes Musculoskeletal: no appreciable defects Psychiatric: normal mood and affect  Allergies Allergies  Allergen Reactions   Bee Venom Swelling   Wasp Venom Swelling   Yellow Jacket Venom Swelling    Current Medications Patient's Medications  New Prescriptions   No medications on file  Previous Medications   ACETAMINOPHEN  (TYLENOL ) 500 MG TABLET    Take 2 tablets (1,000 mg total) by mouth every 6 (six) hours.   ALENDRONATE (FOSAMAX) 70 MG TABLET    Take 1 tablet (70 mg total) by mouth once a week.   ASPIRIN EC 81 MG TABLET    Take 81 mg by mouth daily. Swallow whole.   AZATHIOPRINE (IMURAN) 50 MG TABLET    Take 50 mg by mouth daily.   CHOLECALCIFEROL  (VITAMIN D3) 25 MCG (1000 UNIT) TABLET    Take 1,000 Units by mouth daily.   COLCHICINE  0.6 MG TABLET    Take 1 tablet (0.6 mg total) by mouth 2 (two) times daily.   DAPAGLIFLOZIN   PROPANEDIOL (FARXIGA ) 10 MG TABS TABLET    Take 1 tablet (10 mg total) by mouth daily before breakfast.   FUROSEMIDE  (LASIX ) 20 MG TABLET    Take 1 tablet by mouth once daily   GLIMEPIRIDE  (AMARYL ) 4 MG TABLET    Take 1 tablet (4 mg total) by mouth 2 (two) times daily.   IBUPROFEN  (ADVIL ) 800 MG TABLET    Take 1 tablet (800 mg total) by mouth every 8 (eight) hours as needed.   LOSARTAN  (COZAAR ) 50 MG TABLET    Take 1 tablet (50 mg total) by mouth daily.   MELOXICAM  (MOBIC ) 15 MG TABLET    Take 1 tablet (15 mg total) by mouth daily.   MELOXICAM  (MOBIC ) 15 MG TABLET    Take 15 mg by mouth daily.   METHIMAZOLE  (TAPAZOLE ) 10 MG TABLET    Take 1 tablet (10 mg total) by mouth 2 (two) times daily.   ONDANSETRON  (ZOFRAN -ODT) 4 MG DISINTEGRATING TABLET    Take 1 tablet (4 mg total) by mouth every 6 (  six) hours as needed for nausea or vomiting.   OXYCODONE  (ROXICODONE ) 5 MG IMMEDIATE RELEASE TABLET    Take 1 tablet (5 mg total) by mouth every 8 (eight) hours as needed.   PANTOPRAZOLE  (PROTONIX ) 40 MG TABLET    Take 1 tablet by mouth once daily   SIMVASTATIN  (ZOCOR ) 20 MG TABLET    Take 1 tablet by mouth once daily   TRIAMCINOLONE  OINTMENT (KENALOG ) 0.5 %    Apply 1 Application topically 2 (two) times daily.  Modified Medications   No medications on file  Discontinued Medications   No medications on file    Past Medical History Past Medical History:  Diagnosis Date   Diabetes mellitus type 2, controlled (HCC)    Heart murmur    History of atrial fibrillation    a.) s/p cardiac ablation 2005; no longer sees cardiology; no interventions for rate control or chronic anticoagulation therapy at this point   History of kidney stones    Hyperlipidemia    Hypertension    Osteoarthritis of both knees    Pneumonia    Pulmonary sarcoidosis    Sleep apnea    has never received cpap   Vitamin D  deficiency     Past Surgical History Past Surgical History:  Procedure Laterality Date   APPENDECTOMY  1980    BREAST BIOPSY Left 2017   benign   BREAST EXCISIONAL BIOPSY Right 2013   benign   CARDIAC ELECTROPHYSIOLOGY MAPPING AND ABLATION  2005   COLONOSCOPY  2019   KNEE ARTHROSCOPY W/ MENISCECTOMY Right 03/01/2017   LUNG BIOPSY  2005   sarcoidosis   PARTIAL KNEE ARTHROPLASTY Left 02/17/2022   Procedure: UNICOMPARTMENTAL KNEE;  Surgeon: Edie Norleen PARAS, MD;  Location: [facility];  Service: Orthopedics;  Laterality: Left;   STRABISMUS SURGERY Bilateral    as a child   TOTAL KNEE ARTHROPLASTY Right 12/06/2022   Procedure: TOTAL KNEE ARTHROPLASTY;  Surgeon: Edie Norleen PARAS, MD;  Location: [facility];  Service: Orthopedics;  Laterality: Right;   TUBAL LIGATION      Family History family history includes Breast cancer in her cousin; Heart attack in her mother; Stroke in her father.  Social History Social History   Socioeconomic History   Marital status: Divorced    Spouse name: Not on file   Number of children: 0   Years of education: Not on file   Highest education level: Not on file  Occupational History   Not on file  Tobacco Use   Smoking status: Former    Current packs/day: 0.00    Average packs/day: 0.5 packs/day for 25.0 years (12.5 ttl pk-yrs)    Types: Cigarettes    Start date: 87    Quit date: 2005    Years since quitting: 20.8   Smokeless tobacco: Never  Vaping Use   Vaping status: Never Used  Substance and Sexual Activity   Alcohol use: Not Currently   Drug use: Never   Sexual activity: Not on file  Other Topics Concern   Not on file  Social History Narrative   Lives with niece   Social Drivers of Health   Financial Resource Strain: Low Risk  (06/07/2024)   Received from [facility]   Overall Financial Resource Strain (CARDIA)    Difficulty of Paying Living Expenses: Not hard at all  Food Insecurity: No Food Insecurity (06/07/2024)   Received from [facility]   Hunger Vital Sign    Within the past 12  months, you worried that  your food would run out before you got the money to buy more.: Never true    Within the past 12 months, the food you bought just didn't last and you didn't have money to get more.: Never true  Transportation Needs: No Transportation Needs (06/07/2024)   Received from [facility] - Transportation    In the past 12 months, has lack of transportation kept you from medical appointments or from getting medications?: No    Lack of Transportation (Non-Medical): No  Physical Activity: Not on file  Stress: Not on file  Social Connections: Not on file  Intimate Partner Violence: Not At Risk (12/06/2022)   Humiliation, Afraid, Rape, and Kick questionnaire    Fear of Current or Ex-Partner: No    Emotionally Abused: No    Physically Abused: No    Sexually Abused: No    Laboratory Investigations Lab Results  Component Value Date   TSH 1.44 08/12/2024   TSH <0.01 (L) 05/17/2024   TSH <0.005 (L) 04/11/2024   FREET4 0.7 (L) 08/12/2024   FREET4 1.4 05/17/2024   FREET4 2.75 (H) 04/11/2024     Lab Results  Component Value Date   TSI 192 (H) 05/17/2024     No components found for: TRAB   Lab Results  Component Value Date   CHOL 136 02/29/2024   Lab Results  Component Value Date   HDL 55 02/29/2024   Lab Results  Component Value Date   LDLCALC 67 02/29/2024   Lab Results  Component Value Date   TRIG 68 02/29/2024   Lab Results  Component Value Date   CHOLHDL 2.5 02/29/2024   Lab Results  Component Value Date   CREATININE 0.84 04/30/2024   No results found for: GFR    Component Value Date/Time   NA 137 04/30/2024 2022   NA 142 02/29/2024 1129   NA 137 08/08/2013 0052   K 3.5 04/30/2024 2022   K 3.6 08/08/2013 0052   CL 107 04/30/2024 2022   CL 103 08/08/2013 0052   CO2 22 04/30/2024 2022   CO2 28 08/08/2013 0052   GLUCOSE 201 (H) 04/30/2024 2022   GLUCOSE 209 (H) 08/08/2013 0052   BUN 12 04/30/2024 2022   BUN 14 02/29/2024 1129   BUN 12  08/08/2013 0052   CREATININE 0.84 04/30/2024 2022   CREATININE 1.20 08/08/2013 0052   CALCIUM 9.0 04/30/2024 2022   CALCIUM 9.2 08/08/2013 0052   PROT 7.0 02/29/2024 1129   ALBUMIN 4.1 02/29/2024 1129   AST 18 02/29/2024 1129   ALT 10 02/29/2024 1129   ALKPHOS 103 02/29/2024 1129   BILITOT 0.3 02/29/2024 1129   GFRNONAA >60 04/30/2024 2022   GFRNONAA 50 (L) 08/08/2013 0052   GFRAA 59 (L) 08/08/2013 0052      Latest Ref Rng & Units 04/30/2024    8:22 PM 02/29/2024   11:29 AM 04/04/2023    2:26 PM  BMP  Glucose 70 - 99 mg/dL 798  894  880   BUN 8 - 23 mg/dL 12  14  13    Creatinine 0.44 - 1.00 mg/dL 9.15  9.29  9.17   BUN/Creat Ratio 12 - 28  20  16    Sodium 135 - 145 mmol/L 137  142  142   Potassium 3.5 - 5.1 mmol/L 3.5  4.4  4.9   Chloride 98 - 111 mmol/L 107  107  104   CO2 22 - 32  mmol/L 22  23  25    Calcium 8.9 - 10.3 mg/dL 9.0  9.6  9.6        Component Value Date/Time   WBC 4.9 04/30/2024 2022   RBC 5.52 (H) 04/30/2024 2022   HGB 12.4 04/30/2024 2022   HGB 12.1 02/29/2024 1129   HCT 40.6 04/30/2024 2022   HCT 40.9 02/29/2024 1129   PLT 373 04/30/2024 2022   PLT 352 02/29/2024 1129   MCV 73.6 (L) 04/30/2024 2022   MCV 75 (L) 02/29/2024 1129   MCV 74 (L) 08/08/2013 0052   MCH 22.5 (L) 04/30/2024 2022   MCHC 30.5 04/30/2024 2022   RDW 13.9 04/30/2024 2022   RDW 14.1 02/29/2024 1129   RDW 14.2 08/08/2013 0052   LYMPHSABS 1.3 02/29/2024 1129   MONOABS 0.6 02/04/2022 1458   EOSABS 0.2 02/29/2024 1129   BASOSABS 0.0 02/29/2024 1129      Parts of this note may have been dictated using voice recognition software. There may be variances in spelling and vocabulary which are unintentional. Not all errors are proofread. Please notify the dino if any discrepancies are noted or if the meaning of any statement is not clear.

## 2024-08-20 ENCOUNTER — Ambulatory Visit
Admission: RE | Admit: 2024-08-20 | Discharge: 2024-08-20 | Disposition: A | Discharge status: Home / Self Care | Source: Ambulatory Visit | Attending: "Endocrinology | Admitting: "Endocrinology

## 2024-08-20 DIAGNOSIS — E042 Nontoxic multinodular goiter: Secondary | ICD-10-CM | POA: Diagnosis present

## 2024-08-29 ENCOUNTER — Ambulatory Visit: Admitting: Internal Medicine

## 2024-09-04 ENCOUNTER — Ambulatory Visit: Payer: Self-pay | Admitting: "Endocrinology

## 2024-09-04 ENCOUNTER — Other Ambulatory Visit: Payer: Self-pay | Admitting: Surgery

## 2024-09-04 DIAGNOSIS — M4727 Other spondylosis with radiculopathy, lumbosacral region: Secondary | ICD-10-CM

## 2024-09-04 DIAGNOSIS — M4807 Spinal stenosis, lumbosacral region: Secondary | ICD-10-CM

## 2024-09-11 ENCOUNTER — Ambulatory Visit: Admission: RE | Admit: 2024-09-11 | Discharge: 2024-09-11 | Attending: Surgery

## 2024-09-11 DIAGNOSIS — M4807 Spinal stenosis, lumbosacral region: Secondary | ICD-10-CM | POA: Insufficient documentation

## 2024-09-11 DIAGNOSIS — M4727 Other spondylosis with radiculopathy, lumbosacral region: Secondary | ICD-10-CM | POA: Diagnosis present

## 2024-09-13 ENCOUNTER — Other Ambulatory Visit

## 2024-10-05 ENCOUNTER — Other Ambulatory Visit: Payer: Self-pay | Admitting: "Endocrinology

## 2024-10-07 NOTE — Telephone Encounter (Signed)
 Requested Prescriptions   Pending Prescriptions Disp Refills   methimazole  (TAPAZOLE ) 10 MG tablet [Pharmacy Med Name: methIMAzole  10 MG Oral Tablet] 180 tablet 0    Sig: Take 1 tablet by mouth twice daily
